# Patient Record
Sex: Male | Born: 1940 | Race: Black or African American | Hispanic: No | Marital: Single | Smoking: Current every day smoker
Health system: Southern US, Community
[De-identification: ages and names within clinical notes are randomized; demographics above are authoritative.]

## PROBLEM LIST (undated history)

## (undated) DIAGNOSIS — I219 Acute myocardial infarction, unspecified: Secondary | ICD-10-CM

## (undated) DIAGNOSIS — J449 Chronic obstructive pulmonary disease, unspecified: Secondary | ICD-10-CM

## (undated) DIAGNOSIS — Z9289 Personal history of other medical treatment: Secondary | ICD-10-CM

## (undated) DIAGNOSIS — I38 Endocarditis, valve unspecified: Secondary | ICD-10-CM

## (undated) DIAGNOSIS — K259 Gastric ulcer, unspecified as acute or chronic, without hemorrhage or perforation: Secondary | ICD-10-CM

## (undated) DIAGNOSIS — I10 Essential (primary) hypertension: Secondary | ICD-10-CM

## (undated) DIAGNOSIS — C801 Malignant (primary) neoplasm, unspecified: Secondary | ICD-10-CM

## (undated) DIAGNOSIS — I2510 Atherosclerotic heart disease of native coronary artery without angina pectoris: Secondary | ICD-10-CM

## (undated) DIAGNOSIS — F1010 Alcohol abuse, uncomplicated: Secondary | ICD-10-CM

## (undated) DIAGNOSIS — E785 Hyperlipidemia, unspecified: Secondary | ICD-10-CM

## (undated) DIAGNOSIS — M359 Systemic involvement of connective tissue, unspecified: Secondary | ICD-10-CM

## (undated) DIAGNOSIS — Z8719 Personal history of other diseases of the digestive system: Secondary | ICD-10-CM

## (undated) DIAGNOSIS — I509 Heart failure, unspecified: Secondary | ICD-10-CM

## (undated) HISTORY — PX: INSERT / REPLACE / REMOVE PACEMAKER: SUR710

## (undated) HISTORY — PX: COLON SURGERY: SHX602

## (undated) HISTORY — PX: CORONARY ARTERY BYPASS GRAFT: SHX141

## (undated) HISTORY — PX: COLONOSCOPY: SHX174

## (undated) HISTORY — PX: LUMBAR DISC SURGERY: SHX700

## (undated) HISTORY — PX: HEMORRHOID SURGERY: SHX153

## (undated) HISTORY — PX: UPPER GASTROINTESTINAL ENDOSCOPY: SHX188

---

## 2006-10-28 ENCOUNTER — Ambulatory Visit: Payer: Self-pay | Admitting: Internal Medicine

## 2007-03-22 ENCOUNTER — Ambulatory Visit: Payer: Self-pay | Admitting: Urology

## 2007-03-22 ENCOUNTER — Ambulatory Visit: Payer: Self-pay | Admitting: Cardiology

## 2007-03-22 ENCOUNTER — Other Ambulatory Visit: Payer: Self-pay

## 2007-04-06 ENCOUNTER — Ambulatory Visit: Payer: Self-pay | Admitting: Urology

## 2007-04-20 ENCOUNTER — Ambulatory Visit: Payer: Self-pay | Admitting: Urology

## 2007-04-24 ENCOUNTER — Ambulatory Visit: Payer: Self-pay | Admitting: Radiation Oncology

## 2007-04-28 ENCOUNTER — Ambulatory Visit: Payer: Self-pay | Admitting: Radiation Oncology

## 2007-05-22 ENCOUNTER — Ambulatory Visit: Payer: Self-pay | Admitting: Radiation Oncology

## 2007-06-22 ENCOUNTER — Ambulatory Visit: Payer: Self-pay | Admitting: Radiation Oncology

## 2007-07-22 ENCOUNTER — Ambulatory Visit: Payer: Self-pay | Admitting: Radiation Oncology

## 2007-08-22 ENCOUNTER — Ambulatory Visit: Payer: Self-pay | Admitting: Radiation Oncology

## 2007-10-22 ENCOUNTER — Ambulatory Visit: Payer: Self-pay | Admitting: Radiation Oncology

## 2007-11-16 ENCOUNTER — Ambulatory Visit: Payer: Self-pay | Admitting: Radiation Oncology

## 2007-11-22 ENCOUNTER — Ambulatory Visit: Payer: Self-pay | Admitting: Radiation Oncology

## 2008-02-17 ENCOUNTER — Ambulatory Visit: Payer: Self-pay | Admitting: Family Medicine

## 2008-02-20 ENCOUNTER — Ambulatory Visit: Payer: Self-pay | Admitting: Internal Medicine

## 2008-04-23 ENCOUNTER — Ambulatory Visit: Payer: Self-pay | Admitting: Radiation Oncology

## 2008-05-17 ENCOUNTER — Ambulatory Visit: Payer: Self-pay | Admitting: Radiation Oncology

## 2008-05-21 ENCOUNTER — Ambulatory Visit: Payer: Self-pay | Admitting: Radiation Oncology

## 2009-07-17 ENCOUNTER — Ambulatory Visit: Payer: Self-pay | Admitting: Unknown Physician Specialty

## 2009-07-25 ENCOUNTER — Ambulatory Visit: Payer: Self-pay | Admitting: Specialist

## 2009-10-15 ENCOUNTER — Inpatient Hospital Stay: Payer: Self-pay | Admitting: Internal Medicine

## 2009-12-18 ENCOUNTER — Ambulatory Visit: Payer: Self-pay | Admitting: Surgery

## 2009-12-23 ENCOUNTER — Inpatient Hospital Stay: Payer: Self-pay | Admitting: Surgery

## 2009-12-24 LAB — PATHOLOGY REPORT

## 2010-01-20 ENCOUNTER — Ambulatory Visit: Payer: Self-pay | Admitting: Surgery

## 2010-04-28 ENCOUNTER — Ambulatory Visit: Payer: Self-pay | Admitting: Radiation Oncology

## 2010-04-29 LAB — PSA

## 2010-05-22 ENCOUNTER — Ambulatory Visit: Payer: Self-pay | Admitting: Radiation Oncology

## 2011-05-14 ENCOUNTER — Ambulatory Visit: Payer: Self-pay | Admitting: Radiation Oncology

## 2011-05-15 LAB — PSA: PSA: 0.2 ng/mL

## 2011-05-22 ENCOUNTER — Ambulatory Visit: Payer: Self-pay | Admitting: Radiation Oncology

## 2011-07-20 ENCOUNTER — Ambulatory Visit: Payer: Self-pay

## 2011-07-20 ENCOUNTER — Inpatient Hospital Stay: Payer: Self-pay | Admitting: Internal Medicine

## 2011-07-20 LAB — CBC WITH DIFFERENTIAL/PLATELET
Basophil #: 0 10*3/uL (ref 0.0–0.1)
HCT: 41.1 % (ref 40.0–52.0)
Lymphocyte %: 21.6 %
MCH: 33.5 pg (ref 26.0–34.0)
MCHC: 34.2 g/dL (ref 32.0–36.0)
MCV: 98 fL (ref 80–100)
Monocyte #: 0.6 x10 3/mm (ref 0.2–1.0)
Neutrophil #: 2.3 10*3/uL (ref 1.4–6.5)
Neutrophil %: 61.5 %
Platelet: 153 10*3/uL (ref 150–440)
RDW: 13.1 % (ref 11.5–14.5)
WBC: 3.8 10*3/uL (ref 3.8–10.6)

## 2011-07-20 LAB — COMPREHENSIVE METABOLIC PANEL
Albumin: 3.8 g/dL (ref 3.4–5.0)
Anion Gap: 10 (ref 7–16)
Bilirubin,Total: 0.7 mg/dL (ref 0.2–1.0)
Chloride: 100 mmol/L (ref 98–107)
Co2: 30 mmol/L (ref 21–32)
Creatinine: 0.9 mg/dL (ref 0.60–1.30)
EGFR (African American): >60
Glucose: 126 mg/dL — ABNORMAL HIGH (ref 65–99)
Potassium: 4.3 mmol/L (ref 3.5–5.1)
Sodium: 140 mmol/L (ref 136–145)
Total Protein: 6.7 g/dL (ref 6.4–8.2)

## 2011-07-20 LAB — CBC
HCT: 41.2 % (ref 40.0–52.0)
HGB: 13.8 g/dL (ref 13.0–18.0)
MCHC: 33.6 g/dL (ref 32.0–36.0)
MCV: 98 fL (ref 80–100)
RBC: 4.2 10*6/uL — ABNORMAL LOW (ref 4.40–5.90)
RDW: 13.7 % (ref 11.5–14.5)

## 2011-07-20 LAB — CK TOTAL AND CKMB (NOT AT ARMC): CK-MB: 5.8 ng/mL — ABNORMAL HIGH (ref 0.5–3.6)

## 2011-07-20 LAB — BASIC METABOLIC PANEL
BUN: 9 mg/dL (ref 7–18)
Co2: 29 mmol/L (ref 21–32)
Creatinine: 0.8 mg/dL (ref 0.60–1.30)
EGFR (African American): >60
EGFR (Non-African Amer.): >60
Glucose: 90 mg/dL (ref 65–99)
Potassium: 4.1 mmol/L (ref 3.5–5.1)

## 2011-07-21 LAB — CBC WITH DIFFERENTIAL/PLATELET
Eosinophil #: 0.1 10*3/uL (ref 0.0–0.7)
Eosinophil %: 2.9 %
HCT: 41.9 % (ref 40.0–52.0)
HGB: 14.1 g/dL (ref 13.0–18.0)
Lymphocyte #: 0.9 10*3/uL — ABNORMAL LOW (ref 1.0–3.6)
Lymphocyte %: 27.5 %
MCH: 33.3 pg (ref 26.0–34.0)
MCHC: 33.6 g/dL (ref 32.0–36.0)
Monocyte %: 19.6 %
Neutrophil #: 1.7 10*3/uL (ref 1.4–6.5)
Neutrophil %: 49.3 %
RBC: 4.23 10*6/uL — ABNORMAL LOW (ref 4.40–5.90)
RDW: 13.3 % (ref 11.5–14.5)
WBC: 3.4 10*3/uL — ABNORMAL LOW (ref 3.8–10.6)

## 2011-07-21 LAB — CK TOTAL AND CKMB (NOT AT ARMC)
CK, Total: 502 U/L — ABNORMAL HIGH (ref 35–232)
CK-MB: 5 ng/mL — ABNORMAL HIGH (ref 0.5–3.6)

## 2011-07-21 LAB — BASIC METABOLIC PANEL
Calcium, Total: 8.6 mg/dL (ref 8.5–10.1)
Creatinine: 0.62 mg/dL (ref 0.60–1.30)
EGFR (Non-African Amer.): >60

## 2011-07-21 LAB — HEMOGLOBIN A1C: Hemoglobin A1C: 5.1 % (ref 4.2–6.3)

## 2011-07-21 LAB — LIPID PANEL
Ldl Cholesterol, Calc: 87 mg/dL (ref 0–100)
Triglycerides: 97 mg/dL (ref 0–200)

## 2011-07-22 LAB — CBC WITH DIFFERENTIAL/PLATELET
Basophil #: 0 10*3/uL (ref 0.0–0.1)
Basophil %: 0.6 %
Eosinophil #: 0.1 10*3/uL (ref 0.0–0.7)
HCT: 39.7 % — ABNORMAL LOW (ref 40.0–52.0)
Lymphocyte #: 1.1 10*3/uL (ref 1.0–3.6)
MCH: 33.5 pg (ref 26.0–34.0)
Monocyte #: 0.8 x10 3/mm (ref 0.2–1.0)
Neutrophil %: 62.2 %
Platelet: 148 10*3/uL — ABNORMAL LOW (ref 150–440)
RBC: 4.02 10*6/uL — ABNORMAL LOW (ref 4.40–5.90)

## 2011-07-22 LAB — BASIC METABOLIC PANEL
Calcium, Total: 8.6 mg/dL (ref 8.5–10.1)
Chloride: 108 mmol/L — ABNORMAL HIGH (ref 98–107)
Creatinine: 0.68 mg/dL (ref 0.60–1.30)
EGFR (African American): >60
EGFR (Non-African Amer.): >60
Glucose: 85 mg/dL (ref 65–99)
Osmolality: 275 (ref 275–301)
Potassium: 4 mmol/L (ref 3.5–5.1)
Sodium: 139 mmol/L (ref 136–145)

## 2011-10-21 DIAGNOSIS — J449 Chronic obstructive pulmonary disease, unspecified: Secondary | ICD-10-CM | POA: Insufficient documentation

## 2012-03-21 ENCOUNTER — Ambulatory Visit: Payer: Self-pay | Admitting: Emergency Medicine

## 2012-03-21 DIAGNOSIS — R079 Chest pain, unspecified: Secondary | ICD-10-CM

## 2012-04-26 ENCOUNTER — Emergency Department: Payer: Self-pay | Admitting: Emergency Medicine

## 2012-04-26 LAB — COMPREHENSIVE METABOLIC PANEL
Albumin: 3.6 g/dL (ref 3.4–5.0)
Alkaline Phosphatase: 63 U/L (ref 50–136)
Anion Gap: 8 (ref 7–16)
BUN: 6 mg/dL — ABNORMAL LOW (ref 7–18)
Calcium, Total: 8.7 mg/dL (ref 8.5–10.1)
Chloride: 107 mmol/L (ref 98–107)
Creatinine: 0.86 mg/dL (ref 0.60–1.30)
EGFR (African American): >60
SGOT(AST): 27 U/L (ref 15–37)
SGPT (ALT): 20 U/L (ref 12–78)
Sodium: 137 mmol/L (ref 136–145)

## 2012-04-26 LAB — CBC
HGB: 12.4 g/dL — ABNORMAL LOW (ref 13.0–18.0)
MCH: 33.3 pg (ref 26.0–34.0)
MCHC: 33.4 g/dL (ref 32.0–36.0)
MCV: 100 fL (ref 80–100)
Platelet: 224 10*3/uL (ref 150–440)
RDW: 14.6 % — ABNORMAL HIGH (ref 11.5–14.5)
WBC: 5.4 10*3/uL (ref 3.8–10.6)

## 2012-04-26 LAB — TROPONIN I: Troponin-I: 0.04 ng/mL

## 2012-05-11 ENCOUNTER — Ambulatory Visit: Payer: Self-pay | Admitting: Radiation Oncology

## 2012-05-14 LAB — PSA: PSA: 0.2 ng/mL (ref 0.0–4.0)

## 2012-05-21 ENCOUNTER — Ambulatory Visit: Payer: Self-pay | Admitting: Radiation Oncology

## 2012-06-13 DIAGNOSIS — G4700 Insomnia, unspecified: Secondary | ICD-10-CM | POA: Insufficient documentation

## 2013-05-11 ENCOUNTER — Ambulatory Visit: Payer: Self-pay | Admitting: Radiation Oncology

## 2013-08-07 ENCOUNTER — Ambulatory Visit: Payer: Self-pay | Admitting: Unknown Physician Specialty

## 2013-08-09 LAB — PATHOLOGY REPORT

## 2014-04-26 ENCOUNTER — Ambulatory Visit: Payer: Self-pay | Admitting: Gastroenterology

## 2014-07-15 NOTE — Discharge Summary (Signed)
PATIENT NAME:  Henry Mcmahon, Henry Mcmahon MR#:  [id number] DATE OF BIRTH:  1940-10-10  DATE OF ADMISSION:  07/20/2011 DATE OF DISCHARGE:  07/22/2011  CONSULTANTS: Serafina Royals, MD - Cardiology  DISCHARGE DIAGNOSES:  1. Unstable angina. 2. Coronary artery disease. No acute myocardial infarction.  3. Hypertension. 4. Chronic obstructive pulmonary disease.  PROCEDURES: Cardiac catheterization.   CONDITION: Stable.   HOME MEDICATIONS:  1. Aspirin 81 mg p.o. daily. 2. Lisinopril 10 mg p.o. daily.  3. Lopressor 25 mg p.o. once daily, extended-release. 4. Omeprazole 20 mg p.o. daily.  5. Multivitamin 1 tablet p.o. daily.  6. Ventolin HFA 90 mcg two puffs inhalation p.r.n. every 4 hours.  ADDITIONAL MEDICATIONS:  1. Nitroglycerin 0.4 mg sublingual every five minutes p.r.n., up to three times for chest pain.  2. Lipitor 10 mg p.o. daily.  3. Plavix 75 mg p.o. daily.   DIET: Low sodium diet.   ACTIVITY: As tolerated.   FOLLOWUP CARE: Followup with primary care physician within 1 to 2 weeks. Follow-up with Dr. Nehemiah Massed within one week. Smoking cessation consult.   REASON FOR ADMISSION: Chest pain.   HOSPITAL COURSE:  1. The patient is a 74 year old male with a history of chronic obstructive pulmonary disease, hypertension, and coronary artery disease status post CABG in 2002 at [place] who presented to the ED with chest pain for two months. The patient developed chest pain about one day prior to this admission which was pressure-like and associated with some trouble breathing. He took nitroglycerin with some relief. The patient was seen by Dr. Nehemiah Massed in the ED due to abnormal EKG concerning ST elevation. Dr. Nehemiah Massed recommended the patient's EKG is consistent with MI, so he recommended to admit the patient to the CCU and he needed a cardiac catheterization. Dr. Nehemiah Massed did cardiac catheterization yesterday and placed a drug-eluding stent in the RCA. He recommended continuing aspirin and Plavix and  may be discharged today. After admission the patient has been placed on aspirin, beta blocker, ACE inhibitor, and heparin drip before the cardiac catheterization. The patient's troponin has been negative so acute MI was ruled out.  2. Hypertension. The patient's blood pressure has been controlled with ACE inhibitor and beta blocker.  3. Chronic obstructive pulmonary disease. This has been stable.   On discharge today, his temperature is 98.3, blood pressure 145/63, pulse 54, and oxygen saturation 98% on room air. Physical examination is unremarkable. The patient has no complaints. He is clinically stable and will be discharged to home today. I discussed with the patient and the nurse about the discharge plan.   TIME SPENT: About 40 minutes.  ____________________________ Demetrios Loll, MD qc:slb D: 07/22/2011 15:25:06 ET T: 07/23/2011 15:27:07 ET JOB#: [id number]  cc: Demetrios Loll, MD, <Dictator> Demetrios Loll MD ELECTRONICALLY SIGNED 07/23/2011 16:48

## 2014-07-15 NOTE — Consult Note (Signed)
PATIENT NAME:  Henry Mcmahon, Henry Mcmahon MR#:  [id number] DATE OF BIRTH:  10/19/1940  DATE OF CONSULTATION:  07/20/2011  REFERRING PHYSICIAN:   CONSULTING PHYSICIAN:  Corey Skains, MD  PRIMARY CARE PHYSICIAN: None.   CHIEF COMPLAINT: "I have chest pain."   HISTORY OF PRESENT ILLNESS: This is 74 year old male with known coronary artery disease, status post coronary artery bypass graft in 2002. He has hypertension, hyperlipidemia on appropriate medications with new onset of substernal chest discomfort radiating into the upper abdomen. The patient has had known cardiovascular disease in the past with significant tobacco abuse and occasional alcohol use as well. The patient has had this chest discomfort off and on for the last several weeks to a month but it is intensifying in the last several days. He has had some chest pain last night for which he is seen today in the ER. Patient has had an EKG with laboratories pending. EKG shows normal sinus rhythm with anterior infarct, age undetermined. The patient has had no further chest pain after addition of nitrates.   REVIEW OF SYSTEMS: Remainder of review of systems negative for vision change, ringing in the ears, hearing loss, cough, congestion, heartburn, nausea, vomiting, diarrhea, bloody stools, stomach pain, extremity pain, leg weakness, cramping of the buttocks, known blood clots, headaches, blackouts, dizzy spells, nosebleeds, congestion, trouble swallowing, frequent urination, urination at night, muscle weakness, numbness, anxiety, depression, skin lesions, skin rashes.   PAST MEDICAL HISTORY:  1. Known coronary artery disease. 2. Hypertension.  3. Hyperlipidemia.   FAMILY HISTORY: No family members with early onset of cardiovascular disease.   SOCIAL HISTORY: Smokes 1 pack per day and occasionally drinks alcohol.   ALLERGIES: He has no known drug allergies.   CURRENT MEDICATIONS: As listed.   PHYSICAL EXAMINATION:  VITAL SIGNS: Blood pressure  126/68 bilaterally, heart rate 72 upright, reclining, and regular.   GENERAL: He is a well-appearing male in no acute distress.   HEENT: No icterus, thyromegaly, ulcers, hemorrhage, or xanthelasma.   CARDIOVASCULAR: Regular rate and rhythm. Normal S1, S2 without murmur, gallop, rub. Point of maximal impulse is normal size and placement. Carotid upstroke normal without bruit. Jugular venous pressure normal.   LUNGS: Lungs clear to auscultation with normal respirations.   ABDOMEN: Soft, nontender, without hepatosplenomegaly or masses. Abdominal aorta is normal size without bruit.   EXTREMITIES: 2+ bilateral pulses in dorsal, pedal, radial, and femoral arteries without lower extremity edema, cyanosis, clubbing, ulcers.   NEUROLOGIC: He is oriented to time, place, and person with normal mood and affect.   ASSESSMENT: 74 year old male with known coronary artery disease, hypertension, hyperlipidemia with chronic 3 to 4 week episodes of chest discomfort with significant escalation and waking him up from sleep having an abnormal EKG consistent with myocardial infarction.   RECOMMENDATIONS:  1. Heparin, aspirin, nitrates. 2. Admit to telemetry with telemetry unit nursing following for further significant symptoms and further treatment options.  3. Proceed to cardiac catheterization to assess coronary anatomy and further treatment thereof as necessary. Patient understands the risks and benefits of cardiac catheterization. These include the possibility of death, stroke, heart attack, infection, bleeding, or blood clot. He is at low risk for conscious sedation.  ____________________________ Corey Skains, MD bjk:cms D: 07/20/2011 13:37:39 ET T: 07/20/2011 13:53:58 ET  JOB#: [id number] Corey Skains MD ELECTRONICALLY SIGNED 07/30/2011 13:33

## 2014-07-15 NOTE — H&P (Signed)
PATIENT NAME:  Henry Mcmahon, Henry Mcmahon MR#:  [id number] DATE OF BIRTH:  21-Mar-1941  DATE OF ADMISSION:  07/20/2011  PRIMARY CARE PHYSICIAN: None.   CARDIOLOGIST: Serafina Royals, MD    CHIEF COMPLAINT: Chest pain.   HISTORY OF PRESENT ILLNESS: The patient is a 73 year old male with history of coronary artery disease, sent from [facility] because of chest pain and right EKG changes. The patient has been having chest pain for almost two months, and this morning he felt like pressure in the chest associated with some trouble breathing. He took some nitroglycerin around 1:00 with some relief. The patient went to [facility]. He says that chest pressure is mainly in the middle of the chest, not radiating to the shoulder or the back of the neck. He denies any dizziness. He has no nausea, no vomiting, but does have exertional dyspnea. The patient also complains of weakness. The patient says the chest pain is relieved with nitrates. The patient was seen by Dr. Nehemiah Massed in the Emergency Room because the EKG done in the Emergency Room was concerning for ST elevations. So, the Emergency Room doctor has consulted Dr. Nehemiah Massed. He recommended the patient's EKG is consistent with myocardial infarction, and he is scheduled for cardiac catheterization tomorrow.   PAST MEDICAL/SURGICAL HISTORY: Significant for: 1. Chronic obstructive pulmonary disease. 2. Hypertension. 3. Coronary artery disease.  4. History of bypass surgery in 2002 at [facility].  ALLERGIES: No known allergies.   SOCIAL HISTORY: He lives with his sister. He smokes 1 pack of cigarettes for 3 days. He is trying to quit. No alcohol. No drugs.   MEDICATIONS:  1. Aspirin 81 mg daily. 2. Lisinopril 10 mg daily. 3. Toprol XL 25 mg daily.   4. Omeprazole 20 mg daily. 5. Ventolin 90 mcg inhalation as needed.   FAMILY HISTORY: Significant for father had coronary artery disease and peripheral vascular disease.   REVIEW OF SYSTEMS: CONSTITUTIONAL:  Has weakness but denies chest pain at this time. EYES: No blurred vision. ENT: No tinnitus. No ear pain. No epistaxis. No difficulty swallowing. RESPIRATORY: Has no cough. Has history of chronic obstructive pulmonary disease. CARDIOVASCULAR: Chest pain and has dyspnea on exertion. No syncope. No palpitations. GASTROINTESTINAL: No nausea. No vomiting. No abdominal pain. GENITOURINARY: No dysuria. ENDOCRINE: No polydipsia or nocturia. HEMATOLOGIC: No anemia or easy bruising. INTEGUMENT: No skin rashes. MUSCULOSKELETAL: Denies joint pain. NEUROLOGICAL: No numbness or transient ischemic attacks or seizures. PSYCHIATRIC: Has no anxiety or depression.   PHYSICAL EXAMINATION:  VITAL SIGNS: The patient's temperature is 96.9, pulse 65, respirations 18, blood pressure initially 193/91; and during my visit the blood pressure was 161/72, pulse 61.   GENERAL: The patient is a 74 year old male, not in distress, answering questions appropriately.   HEENT: Head: Atraumatic, normocephalic. Pupils are equally reacting to light. Extraocular movements are intact. ENT: No tympanic membrane congestion. No turbinate hypertrophy. No oropharyngeal erythema.   NECK: Normal range of motion. No thyroid enlargement. No JVD. No lymphadenopathy.   CARDIOVASCULAR: S1 and S2 regular. The patient's PMI is not displaced. No JVD.   LUNGS: Clear to auscultation. No wheeze. No rales.   ABDOMEN: Soft, nontender, nondistended. Bowel sounds are present.   EXTREMITIES: No extremity edema. No cyanosis. No clubbing.   SKIN: Warm and dry.   NEUROLOGIC: Cranial nerves II through XII are intact. No focal neurological deficit.   PSYCHIATRIC: Oriented to time, place, and person.   LABORATORY, DIAGNOSTIC AND RADIOLOGICAL DATA:  WBC 3.8, hemoglobin 13.8, hematocrit 41.2,  platelets 160.  Electrolytes: Sodium 139, potassium 4.1, chloride 102, bicarbonate 29, BUN 9, creatinine 0.80, glucose 90. Troponin is 0.02. CK total 608, CPK-MB 6.5.   Chest x-ray shows no acute disease, chronic obstructive pulmonary disease, and has a history of coronary artery bypass graft.  The patient's EKG here in the Emergency Room showed normal sinus with left atrial enlargement, 66 beats per minute and slight ST elevation in V1, V2. T wave inversions are present in V4, V5 and V6.   ASSESSMENT AND PLAN: The patient is a 74 year old male with a history of coronary artery disease and hypertension, came in because of chest pain symptoms concerning for acute myocardial infarction. The patient also has dyspnea on exertion. The patient is admitted to the Hospitalist Service on the Intensive Care Unit. Continue aspirin, beta blockers, and also nitrates, ACE inhibitors and heparin drip. He was seen by Dr. Nehemiah Massed, who is planning an angiogram. The patient is going to have serial troponins and also EKG. He will be monitored closely in the Intensive Care Unit because of his coronary artery disease and also his EKG changes   TIME SPENT: About 60 minutes of critical care time.   ____________________________ Epifanio Lesches, MD sk:cbb D: 07/20/2011 14:52:23 ET T: 07/20/2011 15:27:58 ET JOB#: [id number]  cc: Epifanio Lesches, MD, <Dictator> Epifanio Lesches MD ELECTRONICALLY SIGNED 07/21/2011 22:30

## 2014-08-31 DIAGNOSIS — I10 Essential (primary) hypertension: Secondary | ICD-10-CM | POA: Insufficient documentation

## 2014-09-03 ENCOUNTER — Other Ambulatory Visit: Payer: Self-pay | Admitting: Gastroenterology

## 2014-09-03 DIAGNOSIS — R634 Abnormal weight loss: Secondary | ICD-10-CM

## 2014-09-03 DIAGNOSIS — R197 Diarrhea, unspecified: Secondary | ICD-10-CM

## 2014-09-07 ENCOUNTER — Ambulatory Visit
Admission: RE | Admit: 2014-09-07 | Discharge: 2014-09-07 | Disposition: A | Discharge status: Home / Self Care | Payer: Medicare Other | Source: Ambulatory Visit | Attending: Gastroenterology | Admitting: Gastroenterology

## 2014-09-07 DIAGNOSIS — R634 Abnormal weight loss: Secondary | ICD-10-CM | POA: Diagnosis present

## 2014-09-07 DIAGNOSIS — R197 Diarrhea, unspecified: Secondary | ICD-10-CM | POA: Insufficient documentation

## 2014-09-07 HISTORY — DX: Systemic involvement of connective tissue, unspecified: M35.9

## 2014-09-07 MED ORDER — IOHEXOL 300 MG/ML  SOLN
85.0000 mL | Freq: Once | INTRAMUSCULAR | Status: AC | PRN
  Administered 2014-09-07: 100 mL via INTRAVENOUS

## 2014-09-11 ENCOUNTER — Other Ambulatory Visit: Payer: Self-pay | Admitting: Gastroenterology

## 2014-09-11 DIAGNOSIS — R634 Abnormal weight loss: Secondary | ICD-10-CM

## 2014-09-11 DIAGNOSIS — K529 Noninfective gastroenteritis and colitis, unspecified: Secondary | ICD-10-CM

## 2014-09-13 DIAGNOSIS — I447 Left bundle-branch block, unspecified: Secondary | ICD-10-CM | POA: Insufficient documentation

## 2014-09-17 ENCOUNTER — Ambulatory Visit
Admission: RE | Admit: 2014-09-17 | Discharge: 2014-09-17 | Disposition: A | Discharge status: Home / Self Care | Payer: Medicare Other | Source: Ambulatory Visit | Attending: Gastroenterology | Admitting: Gastroenterology

## 2014-09-17 DIAGNOSIS — Z9889 Other specified postprocedural states: Secondary | ICD-10-CM | POA: Diagnosis not present

## 2014-09-17 DIAGNOSIS — K219 Gastro-esophageal reflux disease without esophagitis: Secondary | ICD-10-CM | POA: Insufficient documentation

## 2014-09-17 DIAGNOSIS — K529 Noninfective gastroenteritis and colitis, unspecified: Secondary | ICD-10-CM

## 2014-09-17 DIAGNOSIS — R634 Abnormal weight loss: Secondary | ICD-10-CM | POA: Diagnosis present

## 2014-09-17 DIAGNOSIS — R197 Diarrhea, unspecified: Secondary | ICD-10-CM | POA: Diagnosis present

## 2014-12-20 DIAGNOSIS — T82837A Hemorrhage of cardiac prosthetic devices, implants and grafts, initial encounter: Secondary | ICD-10-CM | POA: Insufficient documentation

## 2015-02-28 DIAGNOSIS — I255 Ischemic cardiomyopathy: Secondary | ICD-10-CM | POA: Insufficient documentation

## 2015-03-29 ENCOUNTER — Other Ambulatory Visit: Payer: Self-pay | Admitting: Physical Medicine and Rehabilitation

## 2015-03-29 ENCOUNTER — Other Ambulatory Visit (HOSPITAL_COMMUNITY): Payer: Self-pay | Admitting: Physical Medicine and Rehabilitation

## 2015-03-29 DIAGNOSIS — M5417 Radiculopathy, lumbosacral region: Secondary | ICD-10-CM

## 2015-04-11 ENCOUNTER — Ambulatory Visit: Payer: Medicare Other | Attending: Physical Medicine and Rehabilitation

## 2015-04-17 DIAGNOSIS — M5136 Other intervertebral disc degeneration, lumbar region: Secondary | ICD-10-CM | POA: Insufficient documentation

## 2015-04-17 DIAGNOSIS — M5416 Radiculopathy, lumbar region: Secondary | ICD-10-CM | POA: Insufficient documentation

## 2015-04-17 DIAGNOSIS — M48062 Spinal stenosis, lumbar region with neurogenic claudication: Secondary | ICD-10-CM | POA: Insufficient documentation

## 2015-09-16 DIAGNOSIS — Z95810 Presence of automatic (implantable) cardiac defibrillator: Secondary | ICD-10-CM | POA: Insufficient documentation

## 2016-01-02 DIAGNOSIS — Z8711 Personal history of peptic ulcer disease: Secondary | ICD-10-CM | POA: Insufficient documentation

## 2016-01-02 DIAGNOSIS — R499 Unspecified voice and resonance disorder: Secondary | ICD-10-CM | POA: Insufficient documentation

## 2016-02-06 ENCOUNTER — Encounter
Admission: RE | Admit: 2016-02-06 | Discharge: 2016-02-06 | Disposition: A | Discharge status: Home / Self Care | Payer: Medicare Other | Source: Ambulatory Visit | Attending: Otolaryngology | Admitting: Otolaryngology

## 2016-02-06 DIAGNOSIS — I10 Essential (primary) hypertension: Secondary | ICD-10-CM | POA: Insufficient documentation

## 2016-02-06 DIAGNOSIS — Z01818 Encounter for other preprocedural examination: Secondary | ICD-10-CM | POA: Insufficient documentation

## 2016-02-06 DIAGNOSIS — J387 Other diseases of larynx: Secondary | ICD-10-CM | POA: Insufficient documentation

## 2016-02-06 DIAGNOSIS — Z01812 Encounter for preprocedural laboratory examination: Secondary | ICD-10-CM | POA: Insufficient documentation

## 2016-02-06 DIAGNOSIS — Z950 Presence of cardiac pacemaker: Secondary | ICD-10-CM | POA: Diagnosis not present

## 2016-02-06 DIAGNOSIS — I2510 Atherosclerotic heart disease of native coronary artery without angina pectoris: Secondary | ICD-10-CM | POA: Insufficient documentation

## 2016-02-06 HISTORY — DX: Chronic obstructive pulmonary disease, unspecified: J44.9

## 2016-02-06 HISTORY — DX: Personal history of other medical treatment: Z92.89

## 2016-02-06 HISTORY — DX: Essential (primary) hypertension: I10

## 2016-02-06 HISTORY — DX: Acute myocardial infarction, unspecified: I21.9

## 2016-02-06 HISTORY — DX: Personal history of other diseases of the digestive system: Z87.19

## 2016-02-06 HISTORY — DX: Gastric ulcer, unspecified as acute or chronic, without hemorrhage or perforation: K25.9

## 2016-02-06 HISTORY — DX: Atherosclerotic heart disease of native coronary artery without angina pectoris: I25.10

## 2016-02-06 HISTORY — DX: Alcohol abuse, uncomplicated: F10.10

## 2016-02-06 HISTORY — DX: Hyperlipidemia, unspecified: E78.5

## 2016-02-06 HISTORY — DX: Endocarditis, valve unspecified: I38

## 2016-02-06 HISTORY — DX: Heart failure, unspecified: I50.9

## 2016-02-06 LAB — BASIC METABOLIC PANEL
Anion gap: 7 (ref 5–15)
BUN: 6 mg/dL (ref 6–20)
CALCIUM: 9.1 mg/dL (ref 8.9–10.3)
CHLORIDE: 107 mmol/L (ref 101–111)
CO2: 25 mmol/L (ref 22–32)
Creatinine, Ser: 0.79 mg/dL (ref 0.61–1.24)
GFR calc Af Amer: >60 mL/min (ref >60)
GFR calc non Af Amer: >60 mL/min (ref >60)
Glucose, Bld: 63 mg/dL — ABNORMAL LOW (ref 65–99)
Potassium: 4.6 mmol/L (ref 3.5–5.1)
Sodium: 139 mmol/L (ref 135–145)

## 2016-02-06 LAB — CBC
HCT: 37.7 % — ABNORMAL LOW (ref 40.0–52.0)
HEMOGLOBIN: 12.4 g/dL — AB (ref 13.0–18.0)
MCH: 25.9 pg — AB (ref 26.0–34.0)
MCHC: 32.8 g/dL (ref 32.0–36.0)
MCV: 78.9 fL — ABNORMAL LOW (ref 80.0–100.0)
Platelets: 203 10*3/uL (ref 150–440)
RBC: 4.78 MIL/uL (ref 4.40–5.90)
RDW: 20.5 % — AB (ref 11.5–14.5)
WBC: 4.1 10*3/uL (ref 3.8–10.6)

## 2016-02-06 NOTE — Patient Instructions (Signed)
Your procedure is scheduled on: Wednesday 02/12/16 Report to Day Surgery. 2ND FLOOR MEDICAL MALL ENTRANCE To find out your arrival time please call [phone] between 1PM - 3PM on Tuesday 02/11/16.  Remember: Instructions that are not followed completely may result in serious medical risk, up to and including death, or upon the discretion of your surgeon and anesthesiologist your surgery may need to be rescheduled.    __X__ 1. Do not eat food or drink liquids after midnight. No gum chewing or hard candies.     __X__ 2. No Alcohol for 24 hours before or after surgery.   ____ 3. Bring all medications with you on the day of surgery if instructed.    __X__ 4. Notify your doctor if there is any change in your medical condition     (cold, fever, infections).     Do not wear jewelry, make-up, hairpins, clips or nail polish.  Do not wear lotions, powders, or perfumes.   Do not shave 48 hours prior to surgery. Men may shave face and neck.  Do not bring valuables to the hospital.    [facility] is not responsible for any belongings or valuables.               Contacts, dentures or bridgework may not be worn into surgery.  Leave your suitcase in the car. After surgery it may be brought to your room.  For patients admitted to the hospital, discharge time is determined by your                treatment team.   Patients discharged the day of surgery will not be allowed to drive home.   Please read over the following fact sheets that you were given:   Pain Booklet   __X__ Take these medicines the morning of surgery with A SIP OF WATER:    1. LISINOPRIL  2. METOPROLOL  3. PANTOPRAZOLE  4.  5.  6.  ____ Fleet Enema (as directed)   ____ Use CHG Soap as directed  __X__ Use inhalers on the day of surgery  ____ Stop metformin 2 days prior to surgery    ____ Take 1/2 of usual insulin dose the night before surgery and none on the morning of surgery.   __X__ Stop Coumadin/Plavix/aspirin on  AS INSTRUCTED BY CARDIOLOGIST.  ____ Stop Anti-inflammatories on    ____ Stop supplements until after surgery.    ____ Bring C-Pap to the hospital.

## 2016-02-07 NOTE — Pre-Procedure Instructions (Signed)
Requested H&P from Dr. Richardson Landry.

## 2016-02-10 NOTE — Pre-Procedure Instructions (Signed)
CLEARED BY DR [place]

## 2016-02-19 ENCOUNTER — Ambulatory Visit
Admission: RE | Admit: 2016-02-19 | Discharge: 2016-02-19 | Disposition: A | Discharge status: Home / Self Care | Payer: Medicare Other | Source: Ambulatory Visit | Attending: Otolaryngology | Admitting: Otolaryngology

## 2016-02-19 ENCOUNTER — Ambulatory Visit: Payer: Medicare Other | Admitting: Certified Registered"

## 2016-02-19 ENCOUNTER — Encounter
Admission: RE | Disposition: A | Discharge status: Home / Self Care | Payer: Self-pay | Source: Ambulatory Visit | Attending: Otolaryngology

## 2016-02-19 ENCOUNTER — Encounter: Payer: Self-pay | Admitting: *Deleted

## 2016-02-19 DIAGNOSIS — I2510 Atherosclerotic heart disease of native coronary artery without angina pectoris: Secondary | ICD-10-CM | POA: Diagnosis not present

## 2016-02-19 DIAGNOSIS — J449 Chronic obstructive pulmonary disease, unspecified: Secondary | ICD-10-CM | POA: Diagnosis not present

## 2016-02-19 DIAGNOSIS — I110 Hypertensive heart disease with heart failure: Secondary | ICD-10-CM | POA: Insufficient documentation

## 2016-02-19 DIAGNOSIS — F17200 Nicotine dependence, unspecified, uncomplicated: Secondary | ICD-10-CM | POA: Diagnosis not present

## 2016-02-19 DIAGNOSIS — I509 Heart failure, unspecified: Secondary | ICD-10-CM | POA: Insufficient documentation

## 2016-02-19 DIAGNOSIS — C320 Malignant neoplasm of glottis: Secondary | ICD-10-CM | POA: Insufficient documentation

## 2016-02-19 DIAGNOSIS — K279 Peptic ulcer, site unspecified, unspecified as acute or chronic, without hemorrhage or perforation: Secondary | ICD-10-CM | POA: Insufficient documentation

## 2016-02-19 DIAGNOSIS — I252 Old myocardial infarction: Secondary | ICD-10-CM | POA: Diagnosis not present

## 2016-02-19 HISTORY — PX: MICROLARYNGOSCOPY: SHX5208

## 2016-02-19 SURGERY — MICROLARYNGOSCOPY
Anesthesia: General | Wound class: Clean

## 2016-02-19 MED ORDER — ONDANSETRON HCL 4 MG/2ML IJ SOLN
INTRAMUSCULAR | Status: DC | PRN
  Administered 2016-02-19: 4 mg via INTRAVENOUS

## 2016-02-19 MED ORDER — ROCURONIUM BROMIDE 100 MG/10ML IV SOLN
INTRAVENOUS | Status: DC | PRN
  Administered 2016-02-19: 30 mg via INTRAVENOUS
  Administered 2016-02-19: 10 mg via INTRAVENOUS

## 2016-02-19 MED ORDER — ETOMIDATE 2 MG/ML IV SOLN
INTRAVENOUS | Status: DC | PRN
  Administered 2016-02-19: 14 mg via INTRAVENOUS

## 2016-02-19 MED ORDER — METHYLENE BLUE 0.5 % INJ SOLN
INTRAVENOUS | Status: AC
  Filled 2016-02-19: qty 10

## 2016-02-19 MED ORDER — LACTATED RINGERS IV SOLN
INTRAVENOUS | Status: DC
  Administered 2016-02-19: 06:00:00 via INTRAVENOUS

## 2016-02-19 MED ORDER — FENTANYL CITRATE (PF) 100 MCG/2ML IJ SOLN
INTRAMUSCULAR | Status: DC | PRN
  Administered 2016-02-19: 25 ug via INTRAVENOUS
  Administered 2016-02-19: 100 ug via INTRAVENOUS

## 2016-02-19 MED ORDER — OXYMETAZOLINE HCL 0.05 % NA SOLN
NASAL | Status: DC | PRN
  Administered 2016-02-19: 1

## 2016-02-19 MED ORDER — LIDOCAINE-EPINEPHRINE (PF) 1 %-1:200000 IJ SOLN
INTRAMUSCULAR | Status: AC
  Filled 2016-02-19: qty 30

## 2016-02-19 MED ORDER — LIDOCAINE HCL (CARDIAC) 20 MG/ML IV SOLN
INTRAVENOUS | Status: DC | PRN
  Administered 2016-02-19: 60 mg via INTRAVENOUS

## 2016-02-19 MED ORDER — OXYMETAZOLINE HCL 0.05 % NA SOLN
NASAL | Status: AC
  Filled 2016-02-19: qty 15

## 2016-02-19 MED ORDER — SUGAMMADEX SODIUM 200 MG/2ML IV SOLN
INTRAVENOUS | Status: DC | PRN
  Administered 2016-02-19: 130 mg via INTRAVENOUS

## 2016-02-19 MED ORDER — DEXAMETHASONE SODIUM PHOSPHATE 10 MG/ML IJ SOLN
INTRAMUSCULAR | Status: DC | PRN
  Administered 2016-02-19: 4 mg via INTRAVENOUS

## 2016-02-19 MED ORDER — FENTANYL CITRATE (PF) 100 MCG/2ML IJ SOLN: 25.0000 ug | INTRAMUSCULAR | Status: DC | PRN

## 2016-02-19 MED ORDER — ONDANSETRON HCL 4 MG/2ML IJ SOLN: 4.0000 mg | Freq: Once | INTRAMUSCULAR | Status: DC | PRN

## 2016-02-19 MED ORDER — ESMOLOL HCL 100 MG/10ML IV SOLN
INTRAVENOUS | Status: DC | PRN
  Administered 2016-02-19: 30 mg via INTRAVENOUS

## 2016-02-19 MED ORDER — HYDROCODONE-ACETAMINOPHEN 7.5-325 MG/15ML PO SOLN: ORAL | 0 refills | Status: DC

## 2016-02-19 SURGICAL SUPPLY — 17 items
BANDAGE EYE OVAL (MISCELLANEOUS) ×6 IMPLANT
CANISTER SUCT 1200ML W/VALVE (MISCELLANEOUS) ×3 IMPLANT
DEPRESSOR TONGUE BLADE STERILE (MISCELLANEOUS) ×3 IMPLANT
DRESSING TELFA 4X3 1S ST N-ADH (GAUZE/BANDAGES/DRESSINGS) ×3 IMPLANT
GAUZE SPONGE 4X4 12PLY STRL (GAUZE/BANDAGES/DRESSINGS) ×3 IMPLANT
GLOVE BIO SURGEON STRL SZ7.5 (GLOVE) ×3 IMPLANT
IV SET EXTENSION MINI BORE EPI (IV SETS) ×3 IMPLANT
KIT RM TURNOVER STRD PROC AR (KITS) ×3 IMPLANT
LABEL OR SOLS (LABEL) ×3 IMPLANT
NDL ENDOSCOPIC URO 20G (NEEDLE) ×3 IMPLANT
NEEDLE HYPO 27GX1-1/4 (NEEDLE) ×3 IMPLANT
PACK HEAD/NECK (MISCELLANEOUS) ×3 IMPLANT
PATTIES SURGICAL .5 X.5 (GAUZE/BANDAGES/DRESSINGS) ×3 IMPLANT
SOL ANTI-FOG 6CC FOG-OUT (MISCELLANEOUS) ×1 IMPLANT
SOL FOG-OUT ANTI-FOG 6CC (MISCELLANEOUS) ×2
TUBING SMOKE EVAC 6FT (TUBING) ×3 IMPLANT
WATER STERILE IRR 1000ML POUR (IV SOLUTION) ×3 IMPLANT

## 2016-02-19 NOTE — Op Note (Signed)
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  At an appropriate level, the table was turned 90 degrees away from Anesthesia.  A clean preparation and draping was performed in the standard fashion. The oropharynx, oral cavity, nasopharynx and hypopharynx were palpated with findings as described above.  A Raytec was placed to protect the gingiva. Using the Dedo laryngoscope, the oropharynx, hypopharynx and larynx were carefully inspected. The scope was placed in the endolarynx and into suspension with the Lewy arm. Photodocumentation was obtained with the 0 degree scope. The operating microscope was used for better visualization.   Biopsies were taken from the right true vocal cord. Bleeding was controlled with Afrin moistened pledgets.  The findings were as described above.  The laryngoscope was removed.  The neck was palpated on both sides with the findings as described above.  At this point the procedure was completed.  Dental status was intact.  The patient was returned to Anesthesia, awakened, extubated, and transferred to PACU in satisfactory condition.   Disposition: To PACU, then discharge home  Plan: Soft, bland diet, advance as tolerated. Take pain medications as prescribed. Follow-up in 3 weeks.  Riley Nearing 02/19/2016 8:15 AM

## 2016-02-19 NOTE — OR Nursing (Signed)
Pt advises he's out of his lisinopril for two days, advises last dose Monday 02/19/16 am.  His metoprolol and pantoprazole given in preop (own meds) after taking vital signs.

## 2016-02-19 NOTE — H&P (Signed)
History and physical reviewed and will be scanned in later. No change in medical status reported by the patient or family, appears stable for surgery. All questions regarding the procedure answered, and patient (or family if a child) expressed understanding of the procedure.  Henry Mcmahon S @TODAY@ 

## 2016-02-19 NOTE — Anesthesia Postprocedure Evaluation (Signed)
Anesthesia Post Note  Patient: Henry Mcmahon  Procedure(s) Performed: Procedure(s) (LRB): MICROLARYNGOSCOPY (N/A)  Patient location during evaluation: PACU Anesthesia Type: General Level of consciousness: awake Pain management: pain level controlled Vital Signs Assessment: post-procedure vital signs reviewed and stable Respiratory status: spontaneous breathing Cardiovascular status: stable Anesthetic complications: no    Last Vitals:  Vitals:   02/19/16 0911 02/19/16 0925  BP: (!) 156/73 (!) 147/73  Pulse: 60 (!) 59  Resp: 16 (!) 22  Temp: 36.6 C     Last Pain:  Vitals:   02/19/16 0841  TempSrc:   PainSc: Asleep                 VAN STAVEREN,Lillie Portner

## 2016-02-19 NOTE — Discharge Instructions (Signed)

## 2016-02-19 NOTE — Anesthesia Procedure Notes (Addendum)
Procedure Name: Intubation Performed by: Lance Muss Pre-anesthesia Checklist: Patient identified, Patient being monitored, Timeout performed, Emergency Drugs available and Suction available Patient Re-evaluated:Patient Re-evaluated prior to inductionOxygen Delivery Method: Circle system utilized Preoxygenation: Pre-oxygenation with 100% oxygen Intubation Type: IV induction Ventilation: Mask ventilation without difficulty, Oral airway inserted - appropriate to patient size and Two handed mask ventilation required Laryngoscope Size: Mac and 3 Grade View: Grade I Tube type: Oral Tube size: 6.5 mm Number of attempts: 2 Airway Equipment and Method: Stylet and LTA kit utilized Placement Confirmation: ETT inserted through vocal cords under direct vision,  positive ETCO2 and breath sounds checked- equal and bilateral Secured at: 20 cm Tube secured with: Tape Dental Injury: Teeth and Oropharynx as per pre-operative assessment

## 2016-02-19 NOTE — Anesthesia Preprocedure Evaluation (Signed)
Anesthesia Evaluation  Patient identified by MRN, date of birth, ID band Patient awake    Reviewed: Allergy & Precautions, NPO status   Airway Mallampati: II       Dental  (+) Upper Dentures, Lower Dentures   Pulmonary COPD, Current Smoker,    breath sounds clear to auscultation + decreased breath sounds      Cardiovascular Exercise Tolerance: Good hypertension, Pt. on home beta blockers + CAD, + Past MI and +CHF   Rhythm:Regular Rate:Normal     Neuro/Psych    GI/Hepatic Neg liver ROS, PUD,   Endo/Other  negative endocrine ROS  Renal/GU negative Renal ROS     Musculoskeletal   Abdominal Normal abdominal exam  (+)   Peds negative pediatric ROS (+)  Hematology negative hematology ROS (+)   Anesthesia Other Findings   Reproductive/Obstetrics                             Anesthesia Physical Anesthesia Plan  ASA: III  Anesthesia Plan: General   Post-op Pain Management:    Induction: Intravenous  Airway Management Planned: Oral ETT  Additional Equipment:   Intra-op Plan:   Post-operative Plan: Extubation in OR  Informed Consent: I have reviewed the patients History and Physical, chart, labs and discussed the procedure including the risks, benefits and alternatives for the proposed anesthesia with the patient or authorized representative who has indicated his/her understanding and acceptance.     Plan Discussed with: CRNA  Anesthesia Plan Comments:         Anesthesia Quick Evaluation

## 2016-02-19 NOTE — Transfer of Care (Signed)
Immediate Anesthesia Transfer of Care Note  Patient: Henry Mcmahon  Procedure(s) Performed: Procedure(s): MICROLARYNGOSCOPY (N/A)  Patient Location: PACU  Anesthesia Type:General  Level of Consciousness: sedated and responds to stimulation  Airway & Oxygen Therapy: Patient Spontanous Breathing and Patient connected to face mask oxygen  Post-op Assessment: Report given to RN and Post -op Vital signs reviewed and stable  Post vital signs: Reviewed and stable  Last Vitals:  Vitals:   02/19/16 0825 02/19/16 0827  BP: (!) 184/70 (!) 169/67  Pulse: 63 62  Resp: 16 15  Temp: 36.1 C     Last Pain:  Vitals:   02/19/16 0825  TempSrc:   PainSc: Asleep         Complications: No apparent anesthesia complications

## 2016-02-20 LAB — SURGICAL PATHOLOGY

## 2016-03-02 ENCOUNTER — Ambulatory Visit
Admission: RE | Admit: 2016-03-02 | Discharge: 2016-03-02 | Disposition: A | Discharge status: Home / Self Care | Payer: Medicare Other | Source: Ambulatory Visit | Attending: Radiation Oncology | Admitting: Radiation Oncology

## 2016-03-02 ENCOUNTER — Encounter: Payer: Self-pay | Admitting: Radiation Oncology

## 2016-03-02 ENCOUNTER — Other Ambulatory Visit: Payer: Self-pay | Admitting: *Deleted

## 2016-03-02 VITALS — BP 172/91 | HR 79 | Temp 98.5°F | Wt 145.2 lb

## 2016-03-02 DIAGNOSIS — E785 Hyperlipidemia, unspecified: Secondary | ICD-10-CM | POA: Diagnosis not present

## 2016-03-02 DIAGNOSIS — I2510 Atherosclerotic heart disease of native coronary artery without angina pectoris: Secondary | ICD-10-CM | POA: Insufficient documentation

## 2016-03-02 DIAGNOSIS — R490 Dysphonia: Secondary | ICD-10-CM | POA: Insufficient documentation

## 2016-03-02 DIAGNOSIS — Z79899 Other long term (current) drug therapy: Secondary | ICD-10-CM | POA: Diagnosis not present

## 2016-03-02 DIAGNOSIS — I509 Heart failure, unspecified: Secondary | ICD-10-CM | POA: Diagnosis not present

## 2016-03-02 DIAGNOSIS — I10 Essential (primary) hypertension: Secondary | ICD-10-CM | POA: Insufficient documentation

## 2016-03-02 DIAGNOSIS — Z923 Personal history of irradiation: Secondary | ICD-10-CM | POA: Diagnosis not present

## 2016-03-02 DIAGNOSIS — Z8546 Personal history of malignant neoplasm of prostate: Secondary | ICD-10-CM | POA: Insufficient documentation

## 2016-03-02 DIAGNOSIS — C320 Malignant neoplasm of glottis: Secondary | ICD-10-CM | POA: Diagnosis not present

## 2016-03-02 DIAGNOSIS — Z510 Encounter for antineoplastic radiation therapy: Secondary | ICD-10-CM | POA: Insufficient documentation

## 2016-03-02 DIAGNOSIS — Z7982 Long term (current) use of aspirin: Secondary | ICD-10-CM | POA: Insufficient documentation

## 2016-03-02 DIAGNOSIS — Z8719 Personal history of other diseases of the digestive system: Secondary | ICD-10-CM | POA: Insufficient documentation

## 2016-03-02 DIAGNOSIS — I38 Endocarditis, valve unspecified: Secondary | ICD-10-CM | POA: Diagnosis not present

## 2016-03-02 DIAGNOSIS — I252 Old myocardial infarction: Secondary | ICD-10-CM | POA: Diagnosis not present

## 2016-03-02 DIAGNOSIS — Z8711 Personal history of peptic ulcer disease: Secondary | ICD-10-CM | POA: Diagnosis not present

## 2016-03-02 DIAGNOSIS — C140 Malignant neoplasm of pharynx, unspecified: Secondary | ICD-10-CM

## 2016-03-02 DIAGNOSIS — I998 Other disorder of circulatory system: Secondary | ICD-10-CM | POA: Insufficient documentation

## 2016-03-02 DIAGNOSIS — F17210 Nicotine dependence, cigarettes, uncomplicated: Secondary | ICD-10-CM | POA: Insufficient documentation

## 2016-03-02 DIAGNOSIS — J449 Chronic obstructive pulmonary disease, unspecified: Secondary | ICD-10-CM | POA: Insufficient documentation

## 2016-03-02 NOTE — Consult Note (Signed)
NEW PATIENT EVALUATION  Name: Henry Mcmahon  MRN: [id number]  Date:   03/02/2016     DOB: 03/02/1941   This 75 y.o. 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  REFERRING PHYSICIAN: Robert Bellow, MD  CHIEF COMPLAINT:  Chief Complaint  Patient presents with  . 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 He is done well although recently presented with increasing hoarseness. He was noted to have a lesion of the right true cord and microlaryngoscopy and biopsy were performed. Findings were a lesion exophytic of the right true cord extending to the anterior commissure. 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 Patient is doing fairly well 7 no head and neck pain or dysphagia. His major complaint is his hoarseness. He is seen today for radiation oncology opinion.  PLANNED TREATMENT REGIMEN: External beam radiation therapy  PAST MEDICAL HISTORY:  has a past medical history of CHF (congestive heart failure) ([place]); Collagen vascular disease ([place]); COPD (chronic obstructive pulmonary disease) ([place]); Coronary artery disease; ETOH abuse; Gastric ulcer; H/O: UGI bleed; History of blood transfusion; HLD (hyperlipidemia); Hypertension; Myocardial infarction; and VHD (valvular heart disease).    PAST SURGICAL HISTORY:  Past Surgical History:  Procedure Laterality Date  . COLON SURGERY    . COLONOSCOPY    . CORONARY ARTERY BYPASS GRAFT    . HEMORRHOID SURGERY    . INSERT / REPLACE / REMOVE PACEMAKER    . LUMBAR DISC SURGERY    . MICROLARYNGOSCOPY N/A 02/19/2016   Procedure: MICROLARYNGOSCOPY;  Surgeon: Clyde Canterbury, MD;  Location: [facility] ORS;  Service: ENT;  Laterality: N/A;  . UPPER GASTROINTESTINAL ENDOSCOPY      FAMILY HISTORY: family history is not on file.  SOCIAL HISTORY:  reports that he has been smoking Cigarettes.  He has been smoking about 0.25 packs per day. He has never used smokeless tobacco. He reports that he drinks about 21.6 oz of alcohol per week . He reports that he does not use drugs.  ALLERGIES: Patient has no known allergies.  MEDICATIONS:  Current Outpatient Prescriptions  Medication Sig Dispense Refill  . albuterol (PROVENTIL HFA;VENTOLIN HFA) 108 (90 Base) MCG/ACT inhaler Inhale into the lungs.    Marland Kitchen aspirin EC 81 MG tablet Take by mouth.    Marland Kitchen atorvastatin (LIPITOR) 40 MG tablet Take 40 mg by mouth at bedtime.    . cholecalciferol (VITAMIN D) 1000 units tablet Take 1,000 Units by mouth daily.    . clopidogrel (PLAVIX) 75 MG tablet Take 75 mg by mouth daily.    Marland Kitchen HYDROcodone-acetaminophen (HYCET) 7.5-325 mg/15 ml solution 10-15 cc PO every 4-6 hours as needed for pain 200 mL 0  . lisinopril (PRINIVIL,ZESTRIL) 5 MG tablet Take 5 mg by mouth daily.    . magnesium oxide (MAG-OX) 400 MG tablet Take by mouth.    . metoprolol succinate (TOPROL-XL) 25 MG 24 hr tablet Take 25 mg by mouth daily.    . nitroGLYCERIN (NITROSTAT) 0.4 MG SL tablet Place 0.4 mg under the tongue every 5 (five) minutes as needed for chest pain.    . pantoprazole (PROTONIX) 40  MG tablet Take 40 mg by mouth daily.    Marland Kitchen PROVENTIL HFA 108 (90 Base) MCG/ACT inhaler Inhale 1-2 puffs into the lungs every 4 (four) hours as needed for wheezing or shortness of breath.  1   No current facility-administered medications for this encounter.     ECOG PERFORMANCE STATUS:  1 - Symptomatic but completely ambulatory  REVIEW OF SYSTEMS: Except for the hoarseness of his voice Patient denies any weight loss, fatigue, weakness, fever, chills or night sweats. Patient denies any  loss of vision, blurred vision. Patient denies any ringing  of the ears or hearing loss. No irregular heartbeat. Patient denies heart murmur or history of fainting. Patient denies any chest pain or pain radiating to her upper extremities. Patient denies any shortness of breath, difficulty breathing at night, cough or hemoptysis. Patient denies any swelling in the lower legs. Patient denies any nausea vomiting, vomiting of blood, or coffee ground material in the vomitus. Patient denies any stomach pain. Patient states has had normal bowel movements no significant constipation or diarrhea. Patient denies any dysuria, hematuria or significant nocturia. Patient denies any problems walking, swelling in the joints or loss of balance. Patient denies any skin changes, loss of hair or loss of weight. Patient denies any excessive worrying or anxiety or significant depression. Patient denies any problems with insomnia. Patient denies excessive thirst, polyuria, polydipsia. Patient denies any swollen glands, patient denies easy bruising or easy bleeding. Patient denies any recent infections, allergies or URI. Patient "s visual fields have not changed significantly in recent time.    PHYSICAL EXAM: BP (!) 172/91   Pulse 79   Temp 98.5 F (36.9 C)   Wt 145 lb 2.8 oz (65.9 kg)   BMI 20.83 kg/m  Oral cavity is clear with no oral mucosal lesions noted. Indirect mirror examination shows exophytic lesion of the right cord and some swelling. Otherwise upper airways clear vallecula and base of tongue are within normal limits. Neck is clear without evidence of subject gastric cervical or supraclavicular adenopathy. Well-developed well-nourished patient in NAD. HEENT reveals PERLA, EOMI, discs not visualized.  Oral cavity is clear. No oral mucosal lesions are identified. Neck is clear without evidence of cervical or supraclavicular adenopathy. Lungs are clear to A&P. Cardiac examination is essentially unremarkable with regular  rate and rhythm without murmur rub or thrill. Abdomen is benign with no organomegaly or masses noted. Motor sensory and DTR levels are equal and symmetric in the upper and lower extremities. Cranial nerves II through XII are grossly intact. Proprioception is intact. No peripheral adenopathy or edema is identified. No motor or sensory levels are noted. Crude visual fields are within normal range.  LABORATORY DATA: Pathology reports reviewed    RADIOLOGY RESULTS: CT scan of head and neck ordered   IMPRESSION: Probable stage I (T1 N0 M0) squamous cell carcinoma the larynx in 75 year old male  PLAN: At this time of ordered a CT scan to rule out possibility of adenopathy in the head and neck region. If that is normal will proceed with external beam radiation therapy to 6600 cGy in 33 fractions. Risks and benefits of treatment including possible dysphasia from radiation esophagitis possible worsening of his hoarseness skin reaction fatigue alteration of blood counts all were discussed in detail with the patient. After his CT scan I have personally ordered CT simulation. Patient and wife both noted contact me with any concerns.  I would like to take this opportunity to thank you for allowing me to participate  in the care of your patient.Armstead Peaks., MD

## 2016-03-05 ENCOUNTER — Ambulatory Visit
Admission: RE | Admit: 2016-03-05 | Discharge: 2016-03-05 | Disposition: A | Discharge status: Home / Self Care | Payer: Medicare Other | Source: Ambulatory Visit | Attending: Radiation Oncology | Admitting: Radiation Oncology

## 2016-03-05 DIAGNOSIS — M8448XA Pathological fracture, other site, initial encounter for fracture: Secondary | ICD-10-CM | POA: Insufficient documentation

## 2016-03-05 DIAGNOSIS — C140 Malignant neoplasm of pharynx, unspecified: Secondary | ICD-10-CM | POA: Diagnosis present

## 2016-03-05 HISTORY — DX: Malignant (primary) neoplasm, unspecified: C80.1

## 2016-03-05 MED ORDER — IOPAMIDOL (ISOVUE-300) INJECTION 61%
150.0000 mL | Freq: Once | INTRAVENOUS | Status: AC | PRN
  Administered 2016-03-05: 75 mL via INTRAVENOUS

## 2016-03-18 ENCOUNTER — Ambulatory Visit
Admission: RE | Admit: 2016-03-18 | Discharge: 2016-03-18 | Disposition: A | Discharge status: Home / Self Care | Payer: Medicare Other | Source: Ambulatory Visit | Attending: Radiation Oncology | Admitting: Radiation Oncology

## 2016-03-18 DIAGNOSIS — Z510 Encounter for antineoplastic radiation therapy: Secondary | ICD-10-CM | POA: Diagnosis not present

## 2016-03-19 DIAGNOSIS — Z510 Encounter for antineoplastic radiation therapy: Secondary | ICD-10-CM | POA: Diagnosis not present

## 2016-03-27 ENCOUNTER — Other Ambulatory Visit: Payer: Self-pay | Admitting: *Deleted

## 2016-03-27 DIAGNOSIS — C320 Malignant neoplasm of glottis: Secondary | ICD-10-CM

## 2016-03-30 ENCOUNTER — Ambulatory Visit
Admission: RE | Admit: 2016-03-30 | Discharge: 2016-03-30 | Disposition: A | Discharge status: Home / Self Care | Payer: Medicare Other | Source: Ambulatory Visit | Attending: Radiation Oncology | Admitting: Radiation Oncology

## 2016-03-30 DIAGNOSIS — R490 Dysphonia: Secondary | ICD-10-CM | POA: Diagnosis not present

## 2016-03-30 DIAGNOSIS — Z510 Encounter for antineoplastic radiation therapy: Secondary | ICD-10-CM | POA: Diagnosis not present

## 2016-03-30 DIAGNOSIS — I998 Other disorder of circulatory system: Secondary | ICD-10-CM | POA: Diagnosis not present

## 2016-03-30 DIAGNOSIS — Z923 Personal history of irradiation: Secondary | ICD-10-CM | POA: Diagnosis not present

## 2016-03-30 DIAGNOSIS — I10 Essential (primary) hypertension: Secondary | ICD-10-CM | POA: Diagnosis not present

## 2016-03-30 DIAGNOSIS — Z79899 Other long term (current) drug therapy: Secondary | ICD-10-CM | POA: Diagnosis not present

## 2016-03-30 DIAGNOSIS — I252 Old myocardial infarction: Secondary | ICD-10-CM | POA: Diagnosis not present

## 2016-03-30 DIAGNOSIS — I2510 Atherosclerotic heart disease of native coronary artery without angina pectoris: Secondary | ICD-10-CM | POA: Diagnosis not present

## 2016-03-30 DIAGNOSIS — J449 Chronic obstructive pulmonary disease, unspecified: Secondary | ICD-10-CM | POA: Diagnosis not present

## 2016-03-30 DIAGNOSIS — Z8719 Personal history of other diseases of the digestive system: Secondary | ICD-10-CM | POA: Diagnosis not present

## 2016-03-30 DIAGNOSIS — I38 Endocarditis, valve unspecified: Secondary | ICD-10-CM | POA: Diagnosis not present

## 2016-03-30 DIAGNOSIS — E785 Hyperlipidemia, unspecified: Secondary | ICD-10-CM | POA: Diagnosis not present

## 2016-03-30 DIAGNOSIS — I509 Heart failure, unspecified: Secondary | ICD-10-CM | POA: Diagnosis not present

## 2016-03-30 DIAGNOSIS — F17210 Nicotine dependence, cigarettes, uncomplicated: Secondary | ICD-10-CM | POA: Diagnosis not present

## 2016-03-30 DIAGNOSIS — Z7982 Long term (current) use of aspirin: Secondary | ICD-10-CM | POA: Diagnosis not present

## 2016-03-30 DIAGNOSIS — Z8546 Personal history of malignant neoplasm of prostate: Secondary | ICD-10-CM | POA: Diagnosis not present

## 2016-03-30 DIAGNOSIS — C320 Malignant neoplasm of glottis: Secondary | ICD-10-CM | POA: Diagnosis not present

## 2016-03-30 DIAGNOSIS — Z8711 Personal history of peptic ulcer disease: Secondary | ICD-10-CM | POA: Diagnosis not present

## 2016-03-31 ENCOUNTER — Ambulatory Visit
Admission: RE | Admit: 2016-03-31 | Discharge: 2016-03-31 | Disposition: A | Discharge status: Home / Self Care | Payer: Medicare Other | Source: Ambulatory Visit | Attending: Radiation Oncology | Admitting: Radiation Oncology

## 2016-03-31 DIAGNOSIS — Z510 Encounter for antineoplastic radiation therapy: Secondary | ICD-10-CM | POA: Diagnosis not present

## 2016-04-01 ENCOUNTER — Ambulatory Visit
Admission: RE | Admit: 2016-04-01 | Discharge: 2016-04-01 | Disposition: A | Discharge status: Home / Self Care | Payer: Medicare Other | Source: Ambulatory Visit | Attending: Radiation Oncology | Admitting: Radiation Oncology

## 2016-04-01 DIAGNOSIS — Z510 Encounter for antineoplastic radiation therapy: Secondary | ICD-10-CM | POA: Diagnosis not present

## 2016-04-02 ENCOUNTER — Ambulatory Visit
Admission: RE | Admit: 2016-04-02 | Discharge: 2016-04-02 | Disposition: A | Discharge status: Home / Self Care | Payer: Medicare Other | Source: Ambulatory Visit | Attending: Radiation Oncology | Admitting: Radiation Oncology

## 2016-04-02 DIAGNOSIS — Z510 Encounter for antineoplastic radiation therapy: Secondary | ICD-10-CM | POA: Diagnosis not present

## 2016-04-03 ENCOUNTER — Ambulatory Visit
Admission: RE | Admit: 2016-04-03 | Discharge: 2016-04-03 | Disposition: A | Discharge status: Home / Self Care | Payer: Medicare Other | Source: Ambulatory Visit | Attending: Radiation Oncology | Admitting: Radiation Oncology

## 2016-04-03 DIAGNOSIS — Z510 Encounter for antineoplastic radiation therapy: Secondary | ICD-10-CM | POA: Diagnosis not present

## 2016-04-06 ENCOUNTER — Ambulatory Visit
Admission: RE | Admit: 2016-04-06 | Discharge: 2016-04-06 | Disposition: A | Discharge status: Home / Self Care | Payer: Medicare Other | Source: Ambulatory Visit | Attending: Radiation Oncology | Admitting: Radiation Oncology

## 2016-04-06 ENCOUNTER — Inpatient Hospital Stay: Payer: Medicare Other | Attending: Radiation Oncology

## 2016-04-06 DIAGNOSIS — C140 Malignant neoplasm of pharynx, unspecified: Secondary | ICD-10-CM | POA: Insufficient documentation

## 2016-04-06 DIAGNOSIS — Z510 Encounter for antineoplastic radiation therapy: Secondary | ICD-10-CM | POA: Diagnosis not present

## 2016-04-07 ENCOUNTER — Ambulatory Visit
Admission: RE | Admit: 2016-04-07 | Discharge: 2016-04-07 | Disposition: A | Discharge status: Home / Self Care | Payer: Medicare Other | Source: Ambulatory Visit | Attending: Radiation Oncology | Admitting: Radiation Oncology

## 2016-04-07 DIAGNOSIS — Z510 Encounter for antineoplastic radiation therapy: Secondary | ICD-10-CM | POA: Diagnosis not present

## 2016-04-08 ENCOUNTER — Ambulatory Visit: Payer: Medicare Other

## 2016-04-08 ENCOUNTER — Inpatient Hospital Stay: Payer: Medicare Other

## 2016-04-09 ENCOUNTER — Ambulatory Visit: Payer: Medicare Other

## 2016-04-10 ENCOUNTER — Ambulatory Visit: Payer: Medicare Other

## 2016-04-10 ENCOUNTER — Inpatient Hospital Stay: Payer: Medicare Other

## 2016-04-13 ENCOUNTER — Ambulatory Visit
Admission: RE | Admit: 2016-04-13 | Discharge: 2016-04-13 | Disposition: A | Discharge status: Home / Self Care | Payer: Medicare Other | Source: Ambulatory Visit | Attending: Radiation Oncology | Admitting: Radiation Oncology

## 2016-04-13 DIAGNOSIS — Z510 Encounter for antineoplastic radiation therapy: Secondary | ICD-10-CM | POA: Diagnosis not present

## 2016-04-14 ENCOUNTER — Ambulatory Visit
Admission: RE | Admit: 2016-04-14 | Discharge: 2016-04-14 | Disposition: A | Discharge status: Home / Self Care | Payer: Medicare Other | Source: Ambulatory Visit | Attending: Radiation Oncology | Admitting: Radiation Oncology

## 2016-04-14 DIAGNOSIS — Z510 Encounter for antineoplastic radiation therapy: Secondary | ICD-10-CM | POA: Diagnosis not present

## 2016-04-15 ENCOUNTER — Inpatient Hospital Stay: Payer: Medicare Other

## 2016-04-15 ENCOUNTER — Ambulatory Visit
Admission: RE | Admit: 2016-04-15 | Discharge: 2016-04-15 | Disposition: A | Discharge status: Home / Self Care | Payer: Medicare Other | Source: Ambulatory Visit | Attending: Radiation Oncology | Admitting: Radiation Oncology

## 2016-04-15 DIAGNOSIS — Z510 Encounter for antineoplastic radiation therapy: Secondary | ICD-10-CM | POA: Diagnosis not present

## 2016-04-16 ENCOUNTER — Ambulatory Visit
Admission: RE | Admit: 2016-04-16 | Discharge: 2016-04-16 | Disposition: A | Discharge status: Home / Self Care | Payer: Medicare Other | Source: Ambulatory Visit | Attending: Radiation Oncology | Admitting: Radiation Oncology

## 2016-04-16 DIAGNOSIS — Z510 Encounter for antineoplastic radiation therapy: Secondary | ICD-10-CM | POA: Diagnosis not present

## 2016-04-17 ENCOUNTER — Ambulatory Visit: Payer: Medicare Other

## 2016-04-17 DIAGNOSIS — Z510 Encounter for antineoplastic radiation therapy: Secondary | ICD-10-CM | POA: Diagnosis not present

## 2016-04-20 ENCOUNTER — Ambulatory Visit: Payer: Medicare Other

## 2016-04-20 DIAGNOSIS — Z510 Encounter for antineoplastic radiation therapy: Secondary | ICD-10-CM | POA: Diagnosis not present

## 2016-04-21 ENCOUNTER — Ambulatory Visit: Payer: Medicare Other

## 2016-04-21 DIAGNOSIS — Z510 Encounter for antineoplastic radiation therapy: Secondary | ICD-10-CM | POA: Diagnosis not present

## 2016-04-22 ENCOUNTER — Inpatient Hospital Stay: Payer: Medicare Other

## 2016-04-22 ENCOUNTER — Ambulatory Visit
Admission: RE | Admit: 2016-04-22 | Discharge: 2016-04-22 | Disposition: A | Discharge status: Home / Self Care | Payer: Medicare Other | Source: Ambulatory Visit | Attending: Radiation Oncology | Admitting: Radiation Oncology

## 2016-04-22 DIAGNOSIS — Z510 Encounter for antineoplastic radiation therapy: Secondary | ICD-10-CM | POA: Diagnosis not present

## 2016-04-22 DIAGNOSIS — C320 Malignant neoplasm of glottis: Secondary | ICD-10-CM

## 2016-04-22 DIAGNOSIS — C140 Malignant neoplasm of pharynx, unspecified: Secondary | ICD-10-CM | POA: Diagnosis not present

## 2016-04-22 LAB — CBC
HCT: 38.5 % — ABNORMAL LOW (ref 40.0–52.0)
HEMOGLOBIN: 12.6 g/dL — AB (ref 13.0–18.0)
MCH: 26.9 pg (ref 26.0–34.0)
MCHC: 32.7 g/dL (ref 32.0–36.0)
MCV: 82.4 fL (ref 80.0–100.0)
Platelets: 240 10*3/uL (ref 150–440)
RBC: 4.68 MIL/uL (ref 4.40–5.90)
RDW: 18.9 % — ABNORMAL HIGH (ref 11.5–14.5)
WBC: 4.9 10*3/uL (ref 3.8–10.6)

## 2016-04-23 ENCOUNTER — Ambulatory Visit
Admission: RE | Admit: 2016-04-23 | Discharge: 2016-04-23 | Disposition: A | Discharge status: Home / Self Care | Payer: Medicare Other | Source: Ambulatory Visit | Attending: Radiation Oncology | Admitting: Radiation Oncology

## 2016-04-23 DIAGNOSIS — Z510 Encounter for antineoplastic radiation therapy: Secondary | ICD-10-CM | POA: Diagnosis not present

## 2016-04-24 ENCOUNTER — Ambulatory Visit
Admission: RE | Admit: 2016-04-24 | Discharge: 2016-04-24 | Disposition: A | Discharge status: Home / Self Care | Payer: Medicare Other | Source: Ambulatory Visit | Attending: Radiation Oncology | Admitting: Radiation Oncology

## 2016-04-24 DIAGNOSIS — Z510 Encounter for antineoplastic radiation therapy: Secondary | ICD-10-CM | POA: Diagnosis not present

## 2016-04-27 ENCOUNTER — Other Ambulatory Visit: Payer: Self-pay | Admitting: *Deleted

## 2016-04-27 ENCOUNTER — Ambulatory Visit
Admission: RE | Admit: 2016-04-27 | Discharge: 2016-04-27 | Disposition: A | Discharge status: Home / Self Care | Payer: Medicare Other | Source: Ambulatory Visit | Attending: Radiation Oncology | Admitting: Radiation Oncology

## 2016-04-27 DIAGNOSIS — Z510 Encounter for antineoplastic radiation therapy: Secondary | ICD-10-CM | POA: Diagnosis not present

## 2016-04-28 ENCOUNTER — Other Ambulatory Visit: Payer: Self-pay | Admitting: *Deleted

## 2016-04-28 ENCOUNTER — Ambulatory Visit
Admission: RE | Admit: 2016-04-28 | Discharge: 2016-04-28 | Disposition: A | Discharge status: Home / Self Care | Payer: Medicare Other | Source: Ambulatory Visit | Attending: Radiation Oncology | Admitting: Radiation Oncology

## 2016-04-28 DIAGNOSIS — Z510 Encounter for antineoplastic radiation therapy: Secondary | ICD-10-CM | POA: Diagnosis not present

## 2016-04-28 MED ORDER — DEXAMETHASONE 4 MG PO TABS: 4.0000 mg | ORAL_TABLET | Freq: Every day | ORAL | 0 refills | Status: DC

## 2016-04-28 MED ORDER — SUCRALFATE 1 G PO TABS: 1.0000 g | ORAL_TABLET | Freq: Three times a day (TID) | ORAL | 3 refills | Status: DC

## 2016-04-29 ENCOUNTER — Inpatient Hospital Stay: Payer: Medicare Other | Attending: Radiation Oncology

## 2016-04-29 ENCOUNTER — Ambulatory Visit
Admission: RE | Admit: 2016-04-29 | Discharge: 2016-04-29 | Disposition: A | Discharge status: Home / Self Care | Payer: Medicare Other | Source: Ambulatory Visit | Attending: Radiation Oncology | Admitting: Radiation Oncology

## 2016-04-29 DIAGNOSIS — C140 Malignant neoplasm of pharynx, unspecified: Secondary | ICD-10-CM | POA: Insufficient documentation

## 2016-04-29 DIAGNOSIS — C320 Malignant neoplasm of glottis: Secondary | ICD-10-CM

## 2016-04-29 DIAGNOSIS — Z510 Encounter for antineoplastic radiation therapy: Secondary | ICD-10-CM | POA: Diagnosis not present

## 2016-04-29 LAB — CBC
HCT: 37.8 % — ABNORMAL LOW (ref 40.0–52.0)
Hemoglobin: 12.6 g/dL — ABNORMAL LOW (ref 13.0–18.0)
MCH: 27.3 pg (ref 26.0–34.0)
MCHC: 33.3 g/dL (ref 32.0–36.0)
MCV: 81.9 fL (ref 80.0–100.0)
Platelets: 215 10*3/uL (ref 150–440)
RBC: 4.61 MIL/uL (ref 4.40–5.90)
RDW: 18.1 % — AB (ref 11.5–14.5)
WBC: 5.6 10*3/uL (ref 3.8–10.6)

## 2016-04-30 ENCOUNTER — Ambulatory Visit
Admission: RE | Admit: 2016-04-30 | Discharge: 2016-04-30 | Disposition: A | Discharge status: Home / Self Care | Payer: Medicare Other | Source: Ambulatory Visit | Attending: Radiation Oncology | Admitting: Radiation Oncology

## 2016-04-30 DIAGNOSIS — Z510 Encounter for antineoplastic radiation therapy: Secondary | ICD-10-CM | POA: Diagnosis not present

## 2016-05-01 ENCOUNTER — Inpatient Hospital Stay: Payer: Medicare Other

## 2016-05-01 ENCOUNTER — Ambulatory Visit
Admission: RE | Admit: 2016-05-01 | Discharge: 2016-05-01 | Disposition: A | Discharge status: Home / Self Care | Payer: Medicare Other | Source: Ambulatory Visit | Attending: Radiation Oncology | Admitting: Radiation Oncology

## 2016-05-01 DIAGNOSIS — Z510 Encounter for antineoplastic radiation therapy: Secondary | ICD-10-CM | POA: Diagnosis not present

## 2016-05-04 ENCOUNTER — Inpatient Hospital Stay: Payer: Medicare Other

## 2016-05-04 ENCOUNTER — Ambulatory Visit
Admission: RE | Admit: 2016-05-04 | Discharge: 2016-05-04 | Disposition: A | Discharge status: Home / Self Care | Payer: Medicare Other | Source: Ambulatory Visit | Attending: Radiation Oncology | Admitting: Radiation Oncology

## 2016-05-04 DIAGNOSIS — Z510 Encounter for antineoplastic radiation therapy: Secondary | ICD-10-CM | POA: Diagnosis not present

## 2016-05-04 NOTE — Progress Notes (Signed)
Nutrition Assessment   Reason for Assessment:   Referral from Dr Baruch Gouty regarding swallowing problems due to radiation  treatment of larynx  ASSESSMENT:  76 year old male with stage I squamous cell carcinoma of larynx currently receiving radiation treatment.  Past medical history of prostate cancer, CHF, COPD, CAD, etoh use, smoker, HLD, HTN, MI  Patient reports he has a good appetite now.  Reports about 1 week ago was not able to eat much because couldn't swallow.  "I could only get down oatmeal about 1 week ago." Reports now that he eats oatmeal in the morning with chopped hot dog in it and coffee. Reports does not eat much lunch but snacks on chips and peanut butter crackers during the day. Drinks 6 beers per day. For supper eats chicken noodle soup.  Sister helps him with preparing food and buying food.  Reports that he is drinking ensure original 1 time per day.  Reports does not have any teeth that he went to a bad dentist that pulled all his teeth. The dentures that were made for him cause him gum pain so does not use them.  Reports no trouble chewing meats at this time.  Report no problems with nausea or vomiting. Reports no problems with diarrhea or constipation. Reports no mouth pain or sores in mouth.  Nutrition Focused Physical Exam: Nutrition-Focused physical exam completed. Findings are severe fat depletion, moderate to severe (thigh, hand, temple area only, limited exam) muscle depletion, and none edema.   Patient reports MD in 2002 told him he didn't have much muscle mass in 2002.  Medications: Vit D, Mag ox, protonix  Labs: hgb 12.6  Anthropometrics:   Height: 70 inches Weight: 145 lb UBW: 142 lb per patient over the last few years.  Patient reports weighed 180 pounds in 2002 when had heart attack.   BMI: 20.8  Noted weight in EPIC in 09/07/2014 of 132 pounds  Estimated Energy Needs  Kcals: 1900-2300 calories/d Protein: 79-99 g of protein Fluid: 2.3  L/d  NUTRITION DIAGNOSIS: Dysphagia related to cancer related treatment as evidenced by limiting intake to soft liquid foods.   MALNUTRITION DIAGNOSIS: none at this time   INTERVENTION:   Recommend SLP evaluation secondary to patient receiving radiation treatment to larynx and evaluate for aspiration risk. Message sent to Dr. Donella Stade. Recommend ensure plus/ensure enlive or boost plus vs ensure original to provide additional 350-360 calories in 8 oz serving.  Encouraged patient to drink 3 per day.  May need to increase if weight decreases. Provided samples of both products and coupons. Discussed soft, moist high calorie, high protein foods to assist with dysphagia and no teeth.     MONITORING, EVALUATION, GOAL: patient to consume adequate calories and protein to prevent weight loss   NEXT VISIT: Feb 19th after radiation treatment for weight check and check oral intake  Dameian Crisman [name], [place], [place] (pager)

## 2016-05-05 ENCOUNTER — Ambulatory Visit
Admission: RE | Admit: 2016-05-05 | Discharge: 2016-05-05 | Disposition: A | Discharge status: Home / Self Care | Payer: Medicare Other | Source: Ambulatory Visit | Attending: Radiation Oncology | Admitting: Radiation Oncology

## 2016-05-05 DIAGNOSIS — Z510 Encounter for antineoplastic radiation therapy: Secondary | ICD-10-CM | POA: Diagnosis not present

## 2016-05-06 ENCOUNTER — Ambulatory Visit
Admission: RE | Admit: 2016-05-06 | Discharge: 2016-05-06 | Disposition: A | Discharge status: Home / Self Care | Payer: Medicare Other | Source: Ambulatory Visit | Attending: Radiation Oncology | Admitting: Radiation Oncology

## 2016-05-06 ENCOUNTER — Inpatient Hospital Stay: Payer: Medicare Other

## 2016-05-06 DIAGNOSIS — C140 Malignant neoplasm of pharynx, unspecified: Secondary | ICD-10-CM | POA: Diagnosis not present

## 2016-05-06 DIAGNOSIS — C320 Malignant neoplasm of glottis: Secondary | ICD-10-CM

## 2016-05-06 DIAGNOSIS — Z510 Encounter for antineoplastic radiation therapy: Secondary | ICD-10-CM | POA: Diagnosis not present

## 2016-05-06 LAB — CBC
HCT: 36.9 % — ABNORMAL LOW (ref 40.0–52.0)
Hemoglobin: 12.2 g/dL — ABNORMAL LOW (ref 13.0–18.0)
MCH: 27 pg (ref 26.0–34.0)
MCHC: 33 g/dL (ref 32.0–36.0)
MCV: 81.8 fL (ref 80.0–100.0)
PLATELETS: 275 10*3/uL (ref 150–440)
RBC: 4.51 MIL/uL (ref 4.40–5.90)
RDW: 17.9 % — AB (ref 11.5–14.5)
WBC: 11.5 10*3/uL — AB (ref 3.8–10.6)

## 2016-05-07 ENCOUNTER — Ambulatory Visit
Admission: RE | Admit: 2016-05-07 | Discharge: 2016-05-07 | Disposition: A | Discharge status: Home / Self Care | Payer: Medicare Other | Source: Ambulatory Visit | Attending: Radiation Oncology | Admitting: Radiation Oncology

## 2016-05-07 DIAGNOSIS — Z510 Encounter for antineoplastic radiation therapy: Secondary | ICD-10-CM | POA: Diagnosis not present

## 2016-05-08 ENCOUNTER — Ambulatory Visit
Admission: RE | Admit: 2016-05-08 | Discharge: 2016-05-08 | Disposition: A | Discharge status: Home / Self Care | Payer: Medicare Other | Source: Ambulatory Visit | Attending: Radiation Oncology | Admitting: Radiation Oncology

## 2016-05-08 DIAGNOSIS — Z510 Encounter for antineoplastic radiation therapy: Secondary | ICD-10-CM | POA: Diagnosis not present

## 2016-05-11 ENCOUNTER — Inpatient Hospital Stay: Payer: Medicare Other

## 2016-05-11 ENCOUNTER — Ambulatory Visit
Admission: RE | Admit: 2016-05-11 | Discharge: 2016-05-11 | Disposition: A | Discharge status: Home / Self Care | Payer: Medicare Other | Source: Ambulatory Visit | Attending: Radiation Oncology | Admitting: Radiation Oncology

## 2016-05-11 DIAGNOSIS — Z510 Encounter for antineoplastic radiation therapy: Secondary | ICD-10-CM | POA: Diagnosis not present

## 2016-05-11 NOTE — Progress Notes (Signed)
Nutrition Follow-up:  Patient did not show up for nutrition appointment today following radiation.    Henry Mcmahon [name], [place], [place] (pager)

## 2016-05-12 ENCOUNTER — Ambulatory Visit
Admission: RE | Admit: 2016-05-12 | Discharge: 2016-05-12 | Disposition: A | Discharge status: Home / Self Care | Payer: Medicare Other | Source: Ambulatory Visit | Attending: Radiation Oncology | Admitting: Radiation Oncology

## 2016-05-12 DIAGNOSIS — Z510 Encounter for antineoplastic radiation therapy: Secondary | ICD-10-CM | POA: Diagnosis not present

## 2016-05-13 ENCOUNTER — Ambulatory Visit
Admission: RE | Admit: 2016-05-13 | Discharge: 2016-05-13 | Disposition: A | Discharge status: Home / Self Care | Payer: Medicare Other | Source: Ambulatory Visit | Attending: Radiation Oncology | Admitting: Radiation Oncology

## 2016-05-13 DIAGNOSIS — Z510 Encounter for antineoplastic radiation therapy: Secondary | ICD-10-CM | POA: Diagnosis not present

## 2016-05-14 ENCOUNTER — Ambulatory Visit
Admission: RE | Admit: 2016-05-14 | Discharge: 2016-05-14 | Disposition: A | Discharge status: Home / Self Care | Payer: Medicare Other | Source: Ambulatory Visit | Attending: Radiation Oncology | Admitting: Radiation Oncology

## 2016-05-14 ENCOUNTER — Ambulatory Visit: Payer: Medicare Other

## 2016-05-14 DIAGNOSIS — Z510 Encounter for antineoplastic radiation therapy: Secondary | ICD-10-CM | POA: Diagnosis not present

## 2016-05-14 NOTE — Progress Notes (Signed)
Nutrition Follow-up:  Briefly met with patient following radiation therapy today.    Patient reports that his swallowing is better and that he is eating.  Reports that his sister is getting him ensure to drink.    Final radiation treatment on 2/27.     Anthropometrics:   Weight measured today in radiation and 145 pounds, same weight noted on 2/12.      NUTRITION DIAGNOSIS: Dysphagia related to cancer related treatment improving per pt   MALNUTRITION DIAGNOSIS: none at this time   INTERVENTION:   Encouraged patient to continue to drink ensure/boost plus products at least 3 per day.  Additional samples provided today. Recommend SLP evaluation to evaluate aspiration risk.  Message sent to Dr Baruch Gouty on initial evaluation.       MONITORING, EVALUATION, GOAL: Patient to consume adequate calories and protein to prevent weight loss.   NEXT VISIT:  As needed  Dorris Vangorder [name], [place], [place] Registered Dietitian [phone] (pager)

## 2016-05-15 ENCOUNTER — Ambulatory Visit
Admission: RE | Admit: 2016-05-15 | Discharge: 2016-05-15 | Disposition: A | Discharge status: Home / Self Care | Payer: Medicare Other | Source: Ambulatory Visit | Attending: Radiation Oncology | Admitting: Radiation Oncology

## 2016-05-15 ENCOUNTER — Ambulatory Visit: Payer: Medicare Other

## 2016-05-15 DIAGNOSIS — Z510 Encounter for antineoplastic radiation therapy: Secondary | ICD-10-CM | POA: Diagnosis not present

## 2016-05-18 ENCOUNTER — Ambulatory Visit
Admission: RE | Admit: 2016-05-18 | Discharge: 2016-05-18 | Disposition: A | Discharge status: Home / Self Care | Payer: Medicare Other | Source: Ambulatory Visit | Attending: Radiation Oncology | Admitting: Radiation Oncology

## 2016-05-18 DIAGNOSIS — Z510 Encounter for antineoplastic radiation therapy: Secondary | ICD-10-CM | POA: Diagnosis not present

## 2016-05-19 ENCOUNTER — Ambulatory Visit: Payer: Medicare Other

## 2016-05-19 ENCOUNTER — Ambulatory Visit
Admission: RE | Admit: 2016-05-19 | Discharge: 2016-05-19 | Disposition: A | Discharge status: Home / Self Care | Payer: Medicare Other | Source: Ambulatory Visit | Attending: Radiation Oncology | Admitting: Radiation Oncology

## 2016-05-20 DIAGNOSIS — Z510 Encounter for antineoplastic radiation therapy: Secondary | ICD-10-CM | POA: Diagnosis not present

## 2016-06-11 ENCOUNTER — Encounter: Payer: Self-pay | Admitting: Radiation Oncology

## 2016-06-11 ENCOUNTER — Other Ambulatory Visit: Payer: Self-pay | Admitting: *Deleted

## 2016-06-11 ENCOUNTER — Ambulatory Visit
Admission: RE | Admit: 2016-06-11 | Discharge: 2016-06-11 | Disposition: A | Discharge status: Home / Self Care | Payer: Medicare Other | Source: Ambulatory Visit | Attending: Radiation Oncology | Admitting: Radiation Oncology

## 2016-06-11 VITALS — BP 151/91 | HR 120 | Temp 96.2°F | Resp 20 | Wt 142.3 lb

## 2016-06-11 DIAGNOSIS — F17210 Nicotine dependence, cigarettes, uncomplicated: Secondary | ICD-10-CM | POA: Insufficient documentation

## 2016-06-11 DIAGNOSIS — C329 Malignant neoplasm of larynx, unspecified: Secondary | ICD-10-CM

## 2016-06-11 DIAGNOSIS — R490 Dysphonia: Secondary | ICD-10-CM | POA: Diagnosis not present

## 2016-06-11 DIAGNOSIS — Z923 Personal history of irradiation: Secondary | ICD-10-CM | POA: Diagnosis not present

## 2016-06-11 DIAGNOSIS — C320 Malignant neoplasm of glottis: Secondary | ICD-10-CM

## 2016-06-11 NOTE — Progress Notes (Signed)
Radiation Oncology Follow up Note  Name: Henry Mcmahon   Date:   06/11/2016 MRN:  [id number] DOB: [birth year]    This 76 y.o. male presents to the clinic today for one-month follow-up status post external beam radiation therapy for stage I squamous cell carcinoma the larynx.  REFERRING PROVIDER: No ref. provider found  HPI: Patient is a 76 year old male known to our department having been treated back in 2009 for stage II adenocarcinoma the prostate for which he is done well. He is now 1 month out external beam radiation therapy to his larynx for presumed stage I squamous cell carcinoma. He is seen today in routine follow-up still is having some poor tonality of his voice. He specifically denies head and neck pain or dysphagia. He continues to smoke..  COMPLICATIONS OF TREATMENT: none  FOLLOW UP COMPLIANCE: keeps appointments   PHYSICAL EXAM:  BP (!) 151/91   Pulse (!) 120   Temp (!) 96.2 F (35.7 C)   Resp 20   Wt 142 lb 4.9 oz (64.5 kg)   BMI 20.42 kg/m  Oral cavity is clear no oral mucosal lesions are identified indirect mirror examination shows upper airway clear some significant edema in the larynx is noted. No evidence of mass or nodularity is identified vallecula and base of tongue within normal limits. Neck is clear without evidence of subject gastric cervical or supraclavicular adenopathy. Well-developed well-nourished patient in NAD. HEENT reveals PERLA, EOMI, discs not visualized.  Oral cavity is clear. No oral mucosal lesions are identified. Neck is clear without evidence of cervical or supraclavicular adenopathy. Lungs are clear to A&P. Cardiac examination is essentially unremarkable with regular rate and rhythm without murmur rub or thrill. Abdomen is benign with no organomegaly or masses noted. Motor sensory and DTR levels are equal and symmetric in the upper and lower extremities. Cranial nerves II through XII are grossly intact. Proprioception is intact. No peripheral  adenopathy or edema is identified. No motor or sensory levels are noted. Crude visual fields are within normal range.  RADIOLOGY RESULTS: CT scan with contrast of the neck has been ordered prior to his next follow-up visit  PLAN: Present time patient is doing well I've assured him a will slowly resume normal tonality of his voice. I've also discussed with him the problems with continued smoking and its effect on his larynx. I've asked to see him back in 3 months for follow-up and have ordered a CT scan with contrast of his neck prior to that visit. I've also scheduled a follow-up appointment with Dr. Richardson Mcmahon. Patient knows to call with any concerns.  I would like to take this opportunity to thank you for allowing me to participate in the care of your patient.Henry Mcmahon., MD

## 2016-09-07 ENCOUNTER — Other Ambulatory Visit: Payer: Self-pay | Admitting: *Deleted

## 2016-09-07 DIAGNOSIS — C320 Malignant neoplasm of glottis: Secondary | ICD-10-CM

## 2016-09-08 ENCOUNTER — Ambulatory Visit
Admission: RE | Admit: 2016-09-08 | Discharge: 2016-09-08 | Disposition: A | Discharge status: Home / Self Care | Payer: Medicare Other | Source: Ambulatory Visit | Attending: Radiation Oncology | Admitting: Radiation Oncology

## 2016-09-08 DIAGNOSIS — Z923 Personal history of irradiation: Secondary | ICD-10-CM | POA: Insufficient documentation

## 2016-09-08 DIAGNOSIS — C329 Malignant neoplasm of larynx, unspecified: Secondary | ICD-10-CM | POA: Insufficient documentation

## 2016-09-08 DIAGNOSIS — Z950 Presence of cardiac pacemaker: Secondary | ICD-10-CM | POA: Insufficient documentation

## 2016-09-08 DIAGNOSIS — J439 Emphysema, unspecified: Secondary | ICD-10-CM | POA: Diagnosis not present

## 2016-09-08 DIAGNOSIS — J320 Chronic maxillary sinusitis: Secondary | ICD-10-CM | POA: Insufficient documentation

## 2016-09-08 DIAGNOSIS — I2510 Atherosclerotic heart disease of native coronary artery without angina pectoris: Secondary | ICD-10-CM | POA: Insufficient documentation

## 2016-09-08 DIAGNOSIS — C320 Malignant neoplasm of glottis: Secondary | ICD-10-CM

## 2016-09-08 DIAGNOSIS — I6523 Occlusion and stenosis of bilateral carotid arteries: Secondary | ICD-10-CM | POA: Insufficient documentation

## 2016-09-08 DIAGNOSIS — Z951 Presence of aortocoronary bypass graft: Secondary | ICD-10-CM | POA: Diagnosis not present

## 2016-09-08 LAB — CBC
HEMATOCRIT: 40.6 % (ref 40.0–52.0)
HEMOGLOBIN: 13.4 g/dL (ref 13.0–18.0)
MCH: 28.8 pg (ref 26.0–34.0)
MCHC: 33 g/dL (ref 32.0–36.0)
MCV: 87.5 fL (ref 80.0–100.0)
Platelets: 256 10*3/uL (ref 150–440)
RBC: 4.64 MIL/uL (ref 4.40–5.90)
RDW: 18.9 % — ABNORMAL HIGH (ref 11.5–14.5)
WBC: 6.3 10*3/uL (ref 3.8–10.6)

## 2016-09-08 LAB — CREATININE, SERUM: CREATININE: 0.84 mg/dL (ref 0.61–1.24)

## 2016-09-08 MED ORDER — IOPAMIDOL (ISOVUE-300) INJECTION 61%
75.0000 mL | Freq: Once | INTRAVENOUS | Status: AC | PRN
Start: 2016-09-08 — End: 2016-09-08
  Administered 2016-09-08: 75 mL via INTRAVENOUS

## 2016-09-15 ENCOUNTER — Encounter: Payer: Self-pay | Admitting: Radiation Oncology

## 2016-09-15 ENCOUNTER — Ambulatory Visit
Admission: RE | Admit: 2016-09-15 | Discharge: 2016-09-15 | Disposition: A | Discharge status: Home / Self Care | Payer: Medicare Other | Source: Ambulatory Visit | Attending: Radiation Oncology | Admitting: Radiation Oncology

## 2016-09-15 VITALS — BP 154/91 | HR 106 | Temp 98.0°F | Wt 132.8 lb

## 2016-09-15 DIAGNOSIS — F17210 Nicotine dependence, cigarettes, uncomplicated: Secondary | ICD-10-CM | POA: Diagnosis not present

## 2016-09-15 DIAGNOSIS — Z923 Personal history of irradiation: Secondary | ICD-10-CM | POA: Diagnosis not present

## 2016-09-15 DIAGNOSIS — C320 Malignant neoplasm of glottis: Secondary | ICD-10-CM | POA: Insufficient documentation

## 2016-09-15 NOTE — Progress Notes (Signed)
Radiation Oncology Follow up Note  Name: Henry Mcmahon   Date:   09/15/2016 MRN:  [id number] DOB: Jan 11, 1941    This 76 y.o. male presents to the clinic today for four-month follow-up status post radiation therapy for stage I squamous cell carcinoma the larynx.  REFERRING PROVIDER: No ref. provider found  HPI: Patient is a 76 year old male now out 4 months having completed external beam radiation therapy for stage I squamous cell carcinoma the larynx. He is seen today in routine follow-up. He states his throat is sometimes still sore in his voice is raspy. He continues to smoke. We made follow-up appointments with ENT although the patient does not recall seeing ENT. We've made another appointment with Dr. Richardson Landry today. He specifically denies head and neck pain or significant dysphagia.  COMPLICATIONS OF TREATMENT: none  FOLLOW UP COMPLIANCE: keeps appointments   PHYSICAL EXAM:  BP (!) 154/91   Pulse (!) 106   Temp 98 F (36.7 C)   Wt 132 lb 13.2 oz (60.2 kg)   BMI 19.06 kg/m  Oral cavity is clear. Indirect mirror examination shows some's edema of the larynx cords are mobile. No evidence of mass or nodularity is noted. Vallecula and base of tongue within normal limits. Neck is clear without evidence of subject gastric cervical or supraclavicular adenopathy. Well-developed well-nourished patient in NAD. HEENT reveals PERLA, EOMI, discs not visualized.  Oral cavity is clear. No oral mucosal lesions are identified. Neck is clear without evidence of cervical or supraclavicular adenopathy. Lungs are clear to A&P. Cardiac examination is essentially unremarkable with regular rate and rhythm without murmur rub or thrill. Abdomen is benign with no organomegaly or masses noted. Motor sensory and DTR levels are equal and symmetric in the upper and lower extremities. Cranial nerves II through XII are grossly intact. Proprioception is intact. No peripheral adenopathy or edema is identified. No motor or  sensory levels are noted. Crude visual fields are within normal range.  RADIOLOGY RESULTS: No current films for review  PLAN: Again at the present time I have set up and made arrangements for him to be seen by ENT so he can have a complete endoscopy. I've asked to see the patient back in 6 months for follow-up. I've again explained to him the necessity to cut down at least or quit smoking. Patient knows to call with any concerns.  I would like to take this opportunity to thank you for allowing me to participate in the care of your patient.Armstead Peaks., MD

## 2017-04-05 ENCOUNTER — Encounter: Payer: Self-pay | Admitting: Radiation Oncology

## 2017-04-05 ENCOUNTER — Other Ambulatory Visit: Payer: Self-pay

## 2017-04-05 ENCOUNTER — Ambulatory Visit
Admission: RE | Admit: 2017-04-05 | Discharge: 2017-04-05 | Disposition: A | Discharge status: Home / Self Care | Payer: Medicare Other | Source: Ambulatory Visit | Attending: Radiation Oncology | Admitting: Radiation Oncology

## 2017-04-05 VITALS — BP 124/87 | HR 82 | Temp 96.7°F | Resp 20 | Wt 137.3 lb

## 2017-04-05 DIAGNOSIS — Z923 Personal history of irradiation: Secondary | ICD-10-CM | POA: Insufficient documentation

## 2017-04-05 DIAGNOSIS — C320 Malignant neoplasm of glottis: Secondary | ICD-10-CM | POA: Insufficient documentation

## 2017-04-05 DIAGNOSIS — Z9119 Patient's noncompliance with other medical treatment and regimen: Secondary | ICD-10-CM | POA: Insufficient documentation

## 2017-04-05 DIAGNOSIS — F17210 Nicotine dependence, cigarettes, uncomplicated: Secondary | ICD-10-CM | POA: Diagnosis not present

## 2017-04-05 NOTE — Progress Notes (Signed)
Radiation Oncology Follow up Note  Name: Henry Mcmahon   Date:   04/05/2017 MRN:  [id number] DOB: Sep 29, 1940    This 77 y.o. male presents to the clinic today for 10 month follow-up status post external radiation therapy for stage I squamous cell carcinoma the larynx.  REFERRING PROVIDER: No ref. provider found  HPI: Patient is a 77 year old male now out 10 months having completed external beam radiation therapy to his larynx for stage I squamous cell carcinoma. He has been noncompliant in follow-up with ENT and is a difficult head and neck exam. He specifically denies head and neck pain or dysphagia. He does describe it tickle in his throat at night.. He did have a CT scan performed in June showing soft tissue thickening in the region of the larynx improved compared to February study otherwise negative for any soft tissue masses or adenopathy.  COMPLICATIONS OF TREATMENT: none  FOLLOW UP COMPLIANCE: keeps appointments   PHYSICAL EXAM:  BP 124/87   Pulse 82   Temp (!) 96.7 F (35.9 C)   Resp 20   Wt 137 lb 5.6 oz (62.3 kg)   BMI 19.71 kg/m  Oral cavity is clear no oral mucosal lesions are identified. Patient is noncompliant for indirect mirror examination neck is clear without evidence of subject gastric cervical or supraclavicular adenopathy  RADIOLOGY RESULTS: Previous CT scan from June is reviewed and compatible with the above-stated findings  PLAN: At this time I am trying to arrange a follow-up appoint with ENT for the patient can undergo a direct nasopharyngoscopy. His head and neck exam is extremely difficult and he has been noncompliant with several appointments. I will try to turn follow-up care over to ENT if we can establish that appointment. Patient was given this information and will be called with his follow-up appointment with ENT. Patient knows to call should he have any problems with his follow-up visits.  I would like to take this opportunity to thank you for allowing  me to participate in the care of your patient.Noreene Filbert, MD

## 2017-08-31 DIAGNOSIS — E782 Mixed hyperlipidemia: Secondary | ICD-10-CM | POA: Insufficient documentation

## 2018-05-12 IMAGING — CT CT NECK W/ CM
4 of 5 series · 16 of 35 positions shown, 18 images · IV contrast (iopamidol)
Comparison: None.

CLINICAL DATA: Squamous cell carcinoma right vocal cord. History of
prostate cancer

EXAM:
CT NECK WITH CONTRAST
TECHNIQUE: Multidetector CT imaging of the neck was performed using the
standard protocol following the bolus administration of intravenous
contrast.
CONTRAST:  75mL 9XQQYI-K00 IOPAMIDOL ([id number]-K00) INJECTION 61%

[Series 2: axial neck · axial · 0.67mm/px · z∈[-357,-203]mm · 4 of 127 slices shown]
[im 26/127  bone]
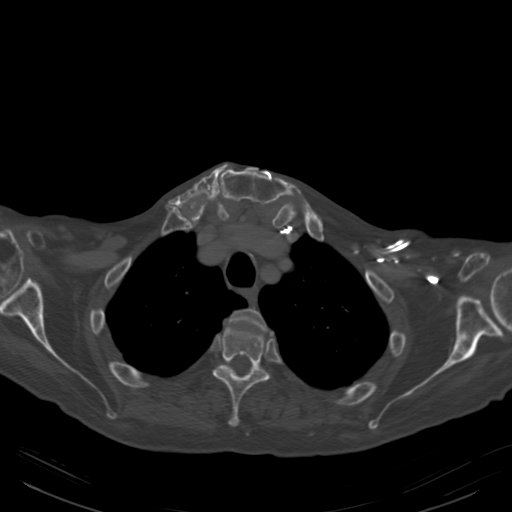
[im 51/127  bone]
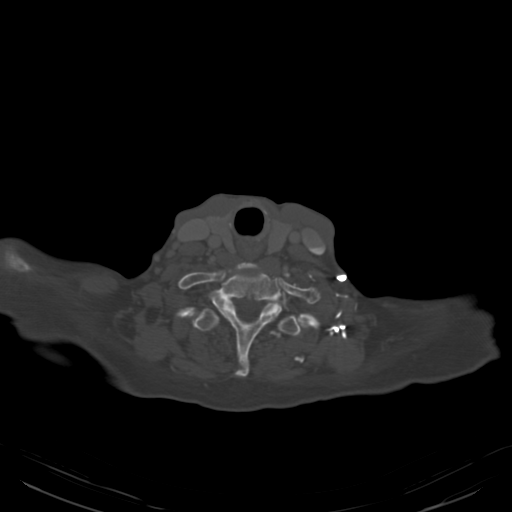
[im 76/127  bone]
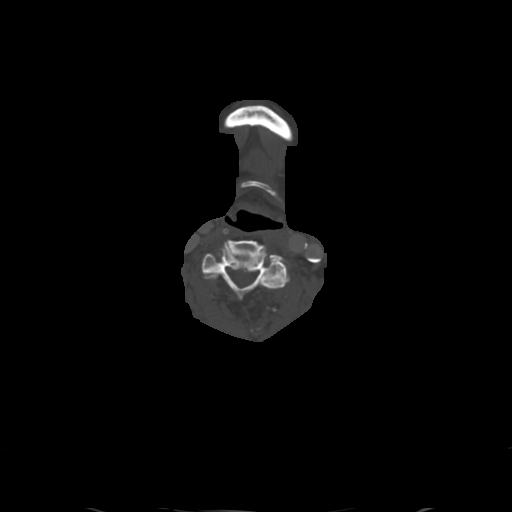
[im 101/127  bone]
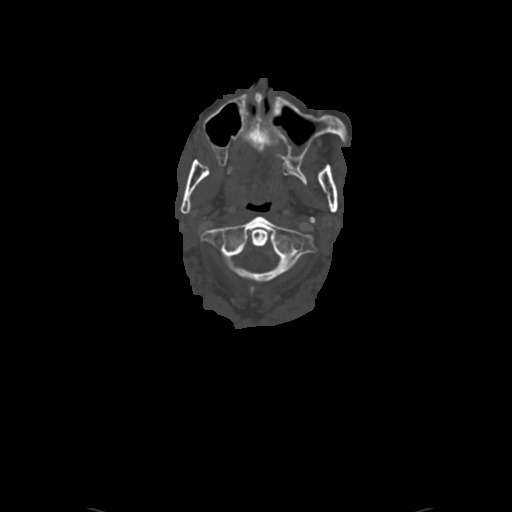

[Series 602: coronal · coronal · 0.67mm/px · 3 of 109 slices shown]
[im 32/109  bone]
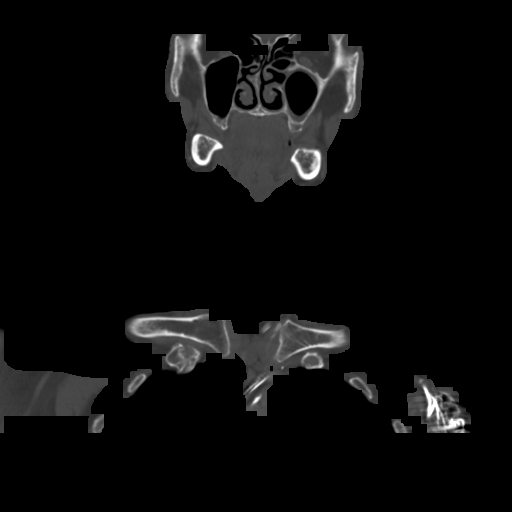
[im 47/109  bone]
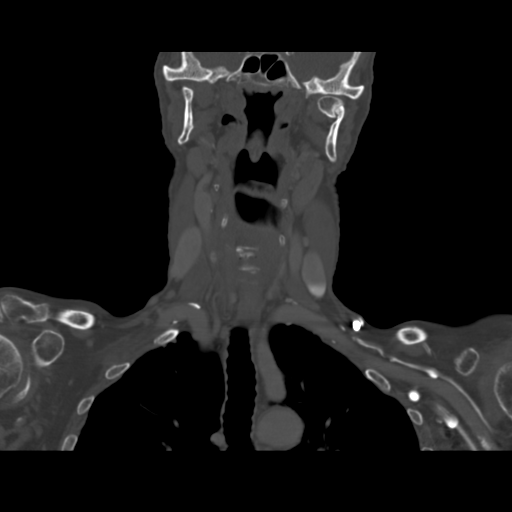
[im 62/109  bone]
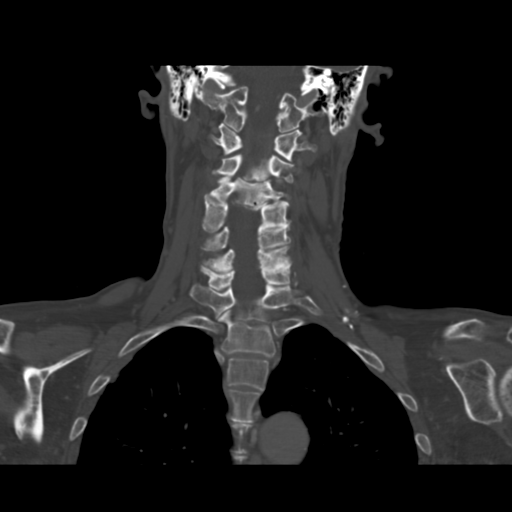

[Series 603: sagittal · sagittal · 0.67mm/px · 5 of 124 slices shown, 6 images]
[im 42/124  bone]
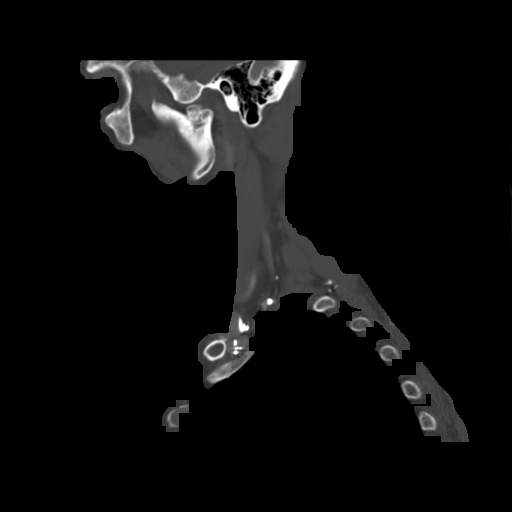
[im 52/124  bone]
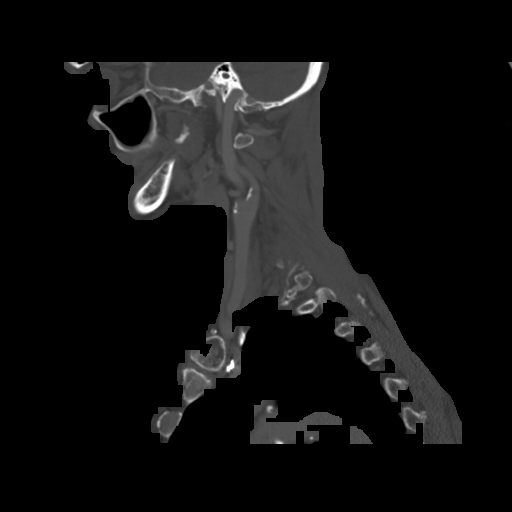
[im 62/124  soft-tissue]
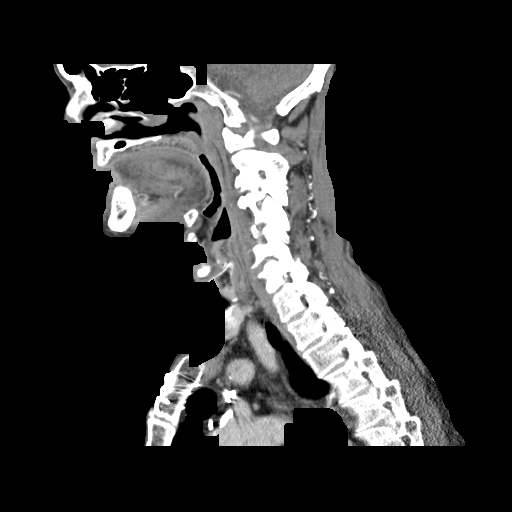
[im 62/124  bone]
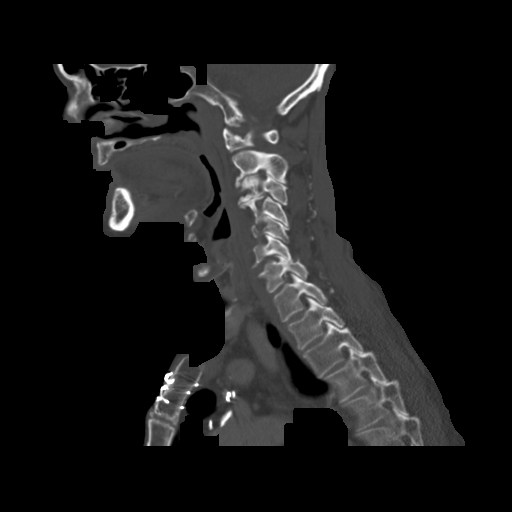
[im 72/124  bone]
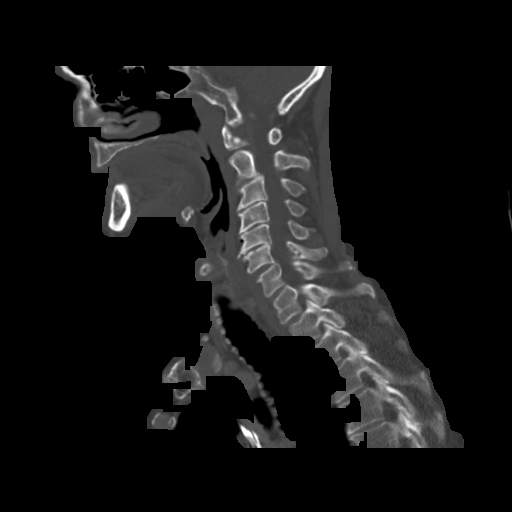
[im 83/124  bone]
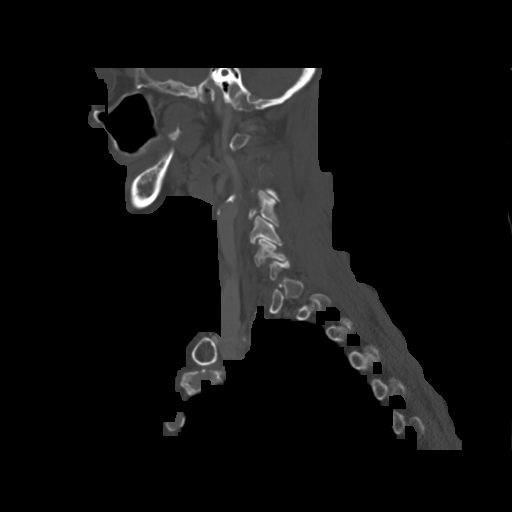

[Series 604: oropharynx · axial · 0.67mm/px · z∈[-416,-240]mm · 4 of 157 slices shown, 5 images]
[im 32/157  soft-tissue]
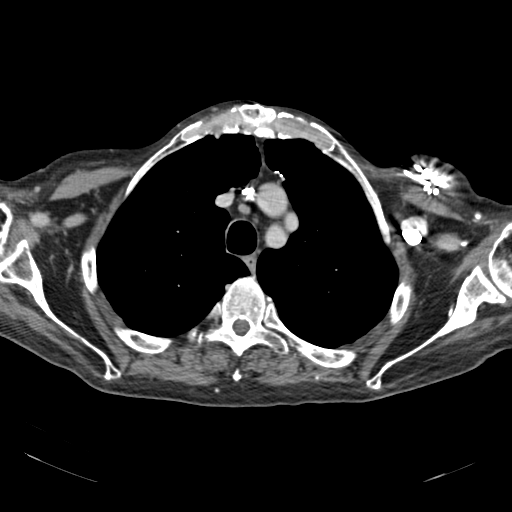
[im 32/157  bone]
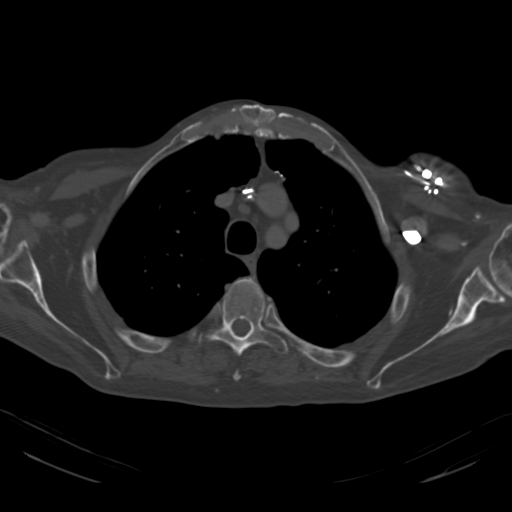
[im 63/157  bone]
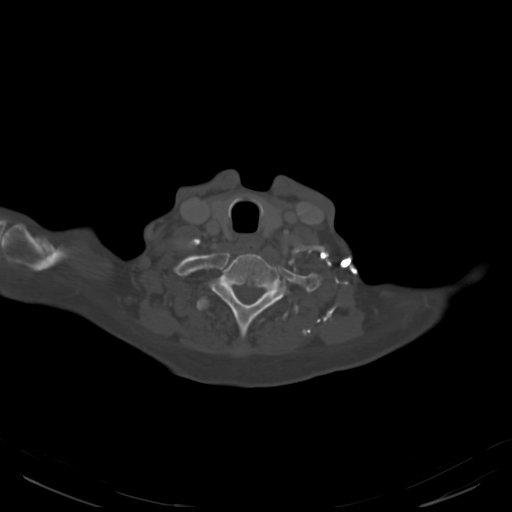
[im 94/157  bone]
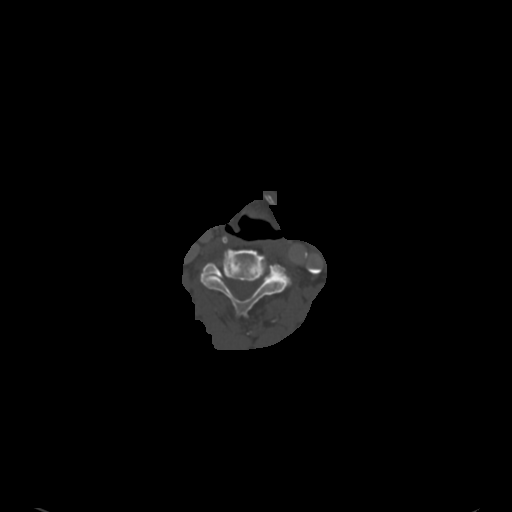
[im 125/157  bone]
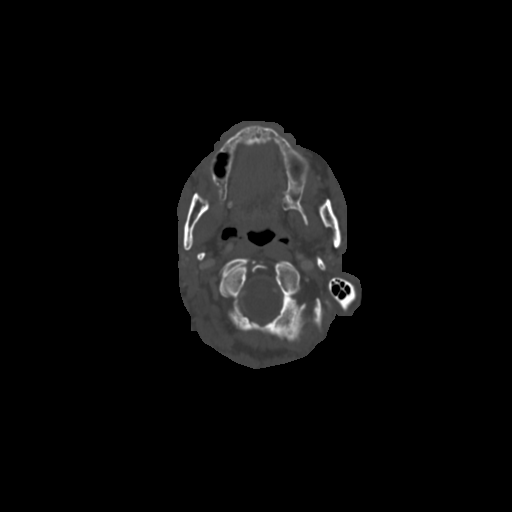

[16 of 35 positions shown; findings below may reference images not displayed]

FINDINGS: Pharynx and larynx: The nasopharynx and oropharynx are normal.
Epiglottis normal. Mild thickening of the anterior vocal cord on the
right compatible with biopsy-proven tumor. Tumor margins not well
defined and difficult to measure. Approximate 1 cm tumor. There
appears to be invasion of the periglottic fat extending to the
thyroid cartilage. No definite thyroid cartilage invasion.

Salivary glands: Negative

Thyroid: Negative

Lymph nodes: No pathologic or enlarged lymph nodes.

Vascular: Carotid artery calcification bilaterally without
significant carotid stenosis. Jugular vein patent bilaterally.

Limited intracranial: Negative

Visualized orbits: Negative

Mastoids and visualized paranasal sinuses: Mild mucosal edema in the
left maxillary sinus. Remaining sinuses clear.

Skeleton: Cervical spine degenerative change and spondylosis CT
through C6. No fracture or metastatic disease. Chronic fracture of
left mandibular condyle with angulation and nonunion. Dislocation of
the condyle anteriorly.

Upper chest: Emphysema and subpleural cysts.  No apical mass lesion.

Other: None
IMPRESSION: Ill-defined mass in the right anterior vocal cord compatible with
biopsy-proven tumor. Tumor appears to extend to the thyroid
cartilage without definite invasion of the cartilage. No pathologic
adenopathy in the neck.

Chronic fracture left mandibular condyle with dislocation of the
condyle and nonunion of the fracture.

## 2019-12-04 ENCOUNTER — Ambulatory Visit (INDEPENDENT_AMBULATORY_CARE_PROVIDER_SITE_OTHER): Payer: Medicare Other | Admitting: Vascular Surgery

## 2019-12-04 ENCOUNTER — Encounter (INDEPENDENT_AMBULATORY_CARE_PROVIDER_SITE_OTHER): Payer: Self-pay | Admitting: Vascular Surgery

## 2019-12-04 ENCOUNTER — Other Ambulatory Visit: Payer: Self-pay

## 2019-12-04 VITALS — BP 169/83 | HR 87 | Ht 70.0 in | Wt 129.0 lb

## 2019-12-04 DIAGNOSIS — I10 Essential (primary) hypertension: Secondary | ICD-10-CM

## 2019-12-04 DIAGNOSIS — J449 Chronic obstructive pulmonary disease, unspecified: Secondary | ICD-10-CM | POA: Diagnosis not present

## 2019-12-04 DIAGNOSIS — I70213 Atherosclerosis of native arteries of extremities with intermittent claudication, bilateral legs: Secondary | ICD-10-CM

## 2019-12-04 DIAGNOSIS — R0989 Other specified symptoms and signs involving the circulatory and respiratory systems: Secondary | ICD-10-CM | POA: Diagnosis not present

## 2019-12-04 DIAGNOSIS — I25118 Atherosclerotic heart disease of native coronary artery with other forms of angina pectoris: Secondary | ICD-10-CM

## 2019-12-04 DIAGNOSIS — Z4502 Encounter for adjustment and management of automatic implantable cardiac defibrillator: Secondary | ICD-10-CM | POA: Insufficient documentation

## 2019-12-04 DIAGNOSIS — M5136 Other intervertebral disc degeneration, lumbar region: Secondary | ICD-10-CM

## 2019-12-10 ENCOUNTER — Encounter (INDEPENDENT_AMBULATORY_CARE_PROVIDER_SITE_OTHER): Payer: Self-pay | Admitting: Vascular Surgery

## 2019-12-10 DIAGNOSIS — I70219 Atherosclerosis of native arteries of extremities with intermittent claudication, unspecified extremity: Secondary | ICD-10-CM | POA: Insufficient documentation

## 2019-12-10 DIAGNOSIS — R0989 Other specified symptoms and signs involving the circulatory and respiratory systems: Secondary | ICD-10-CM | POA: Insufficient documentation

## 2019-12-10 NOTE — Progress Notes (Signed)
MRN : [id number]  Henry Mcmahon is a 79 y.o. (1940/07/31) male who presents with chief complaint of  Chief Complaint  Patient presents with  . New Patient (Initial Visit)    Claudication  .  History of Present Illness:    The patient is seen for evaluation of painful lower extremities and diminished pulses. Patient notes the pain is always associated with activity and is very consistent day today. Typically, the pain occurs at less than one block, progress is as activity continues to the point that the patient must stop walking. Resting including standing still for several minutes allowed resumption of the activity and the ability to walk a similar distance before stopping again. Uneven terrain and inclined shorten the distance. The pain has been progressive over the past several years. The patient states the inability to walk is now having a profound negative impact on quality of life and daily activities.  The patient denies rest pain or dangling of an extremity off the side of the bed during the night for relief. No open wounds or sores at this time. No prior interventions or surgeries.  No history of back problems or DJD of the lumbar sacral spine.   The patient denies changes in claudication symptoms or new rest pain symptoms.  No new ulcers or wounds of the foot.  The patient's blood pressure has been stable and relatively well controlled. The patient denies amaurosis fugax or recent TIA symptoms. There are no recent neurological changes noted. The patient denies history of DVT, PE or superficial thrombophlebitis. The patient denies recent episodes of angina or shortness of breath.   Current Meds  Medication Sig  . aspirin EC 81 MG tablet Take by mouth.  Marland Kitchen atorvastatin (LIPITOR) 40 MG tablet Take 40 mg by mouth at bedtime.  . clopidogrel (PLAVIX) 75 MG tablet Take 75 mg by mouth daily.  . furosemide (LASIX) 20 MG tablet Take by mouth.  . gabapentin (NEURONTIN) 300 MG capsule  Take by mouth.  . isosorbide mononitrate (IMDUR) 30 MG 24 hr tablet Take by mouth.  Marland Kitchen lisinopril (PRINIVIL,ZESTRIL) 5 MG tablet Take 5 mg by mouth daily.  . metoprolol succinate (TOPROL-XL) 25 MG 24 hr tablet Take 25 mg by mouth daily.  . nitroGLYCERIN (NITROSTAT) 0.4 MG SL tablet Place 0.4 mg under the tongue every 5 (five) minutes as needed for chest pain.    Past Medical History:  Diagnosis Date  . Cancer [facility])    prostate  . CHF (congestive heart failure) ([place])   . Collagen vascular disease ([place])   . COPD (chronic obstructive pulmonary disease) ([place])   . Coronary artery disease   . ETOH abuse   . Gastric ulcer   . H/O: UGI bleed   . History of blood transfusion   . HLD (hyperlipidemia)   . Hypertension   . Myocardial infarction ([place])   . VHD (valvular heart disease)     Past Surgical History:  Procedure Laterality Date  . COLON SURGERY    . COLONOSCOPY    . CORONARY ARTERY BYPASS GRAFT    . HEMORRHOID SURGERY    . INSERT / REPLACE / REMOVE PACEMAKER    . LUMBAR DISC SURGERY    . MICROLARYNGOSCOPY N/A 02/19/2016   Procedure: MICROLARYNGOSCOPY;  Surgeon: Clyde Canterbury, MD;  Location: [facility] ORS;  Service: ENT;  Laterality: N/A;  . UPPER GASTROINTESTINAL ENDOSCOPY      Social History Social History   Tobacco Use  . Smoking status:  Current Every Day Smoker    Packs/day: 0.25    Types: Cigarettes  . Smokeless tobacco: Never Used  Substance Use Topics  . Alcohol use: Yes    Alcohol/week: 36.0 standard drinks    Types: 36 Cans of beer per week  . Drug use: No    Family History No family history of bleeding/clotting disorders, porphyria or autoimmune disease   No Known Allergies   REVIEW OF SYSTEMS (Negative unless checked)  Constitutional: [] Weight loss  [] Fever  [] Chills Cardiac: [] Chest pain   [] Chest pressure   [] Palpitations   [] Shortness of breath when laying flat   [] Shortness of breath with exertion. Vascular:  [x] Pain in legs with walking   [] Pain in  legs at rest  [] History of DVT   [] Phlebitis   [x] Swelling in legs   [] Varicose veins   [] Non-healing ulcers Pulmonary:   [] Uses home oxygen   [] Productive cough   [] Hemoptysis   [] Wheeze  [x] COPD   [] Asthma Neurologic:  [] Dizziness   [] Seizures   [] History of stroke   [] History of TIA  [] Aphasia   [] Vissual changes   [] Weakness or numbness in arm   [] Weakness or numbness in leg Musculoskeletal:   [] Joint swelling   [x] Joint pain   [x] Low back pain Hematologic:  [] Easy bruising  [] Easy bleeding   [] Hypercoagulable state   [] Anemic Gastrointestinal:  [] Diarrhea   [] Vomiting  [] Gastroesophageal reflux/heartburn   [] Difficulty swallowing. Genitourinary:  [] Chronic kidney disease   [] Difficult urination  [] Frequent urination   [] Blood in urine Skin:  [] Rashes   [] Ulcers  Psychological:  [] History of anxiety   []  History of major depression.  Physical Examination  Vitals:   12/04/19 1037  BP: (!) 169/83  Pulse: 87  Weight: 129 lb (58.5 kg)  Height: 5\' 10"  (1.778 m)   Body mass index is 18.51 kg/m. Gen: WD/WN, NAD Head: [place]/AT, No temporalis wasting.  Ear/Nose/Throat: Hearing grossly intact, nares w/o erythema or drainage, poor dentition Eyes: PER, EOMI, sclera nonicteric.  Neck: Supple, no masses.  No bruit or JVD.  Pulmonary:  Good air movement, clear to auscultation bilaterally, no use of accessory muscles.  Cardiac: RRR, normal S1, S2, no Murmurs. Vascular: scattered varicosities present bilaterally.  Mild venous stasis changes to the legs bilaterally.  2+ soft pitting edema Vessel Right Left  Radial Palpable Palpable  Popliteal Increased Palpable Increased Palpable  PT Not Palpable Not Palpable  DP Not Palpable Not Palpable  Gastrointestinal: soft, non-distended. No guarding/no peritoneal signs.  Musculoskeletal: M/S 5/5 throughout.  No deformity or atrophy.  Neurologic: CN 2-12 intact. Pain and light touch intact in extremities.  Symmetrical.  Speech is fluent. Motor exam as listed  above. Psychiatric: Judgment intact, Mood & affect appropriate for pt's clinical situation. Dermatologic: mild rashes or ulcers noted.  No changes consistent with cellulitis.   CBC Lab Results  Component Value Date   WBC 6.3 09/08/2016   HGB 13.4 09/08/2016   HCT 40.6 09/08/2016   MCV 87.5 09/08/2016   PLT 256 09/08/2016    BMET    Component Value Date/Time   NA 139 02/06/2016 0828   NA 137 04/26/2012 2220   K 4.6 02/06/2016 0828   K 4.6 04/26/2012 2220   CL 107 02/06/2016 0828   CL 107 04/26/2012 2220   CO2 25 02/06/2016 0828   CO2 22 04/26/2012 2220   GLUCOSE 63 (L) 02/06/2016 0828   GLUCOSE 99 04/26/2012 2220   BUN 6 02/06/2016 0828   BUN 6 (L)  04/26/2012 2220   CREATININE 0.84 09/08/2016 1320   CREATININE 0.86 04/26/2012 2220   CALCIUM 9.1 02/06/2016 0828   CALCIUM 8.7 04/26/2012 2220   GFRNONAA >60 09/08/2016 1320   GFRNONAA >60 04/26/2012 2220   GFRAA >60 09/08/2016 1320   GFRAA >60 04/26/2012 2220   CrCl cannot be calculated (Patient's most recent lab result is older than the maximum 21 days allowed.).  COAG No results found for: INR, PROTIME  Radiology No results found.    Assessment/Plan 1. Atherosclerosis of native artery of both lower extremities with intermittent claudication (HCC) Recommend:  Patient should undergo ABI's and arterial duplex given the patient's lower extremity symptoms.  The patient states they are having increased pain and a marked decrease in the distance that they can walk.  The risks and benefits as well as the alternatives were discussed in detail with the patient.  All questions were answered.  Patient agrees to proceed and understands this could be a prelude to angiography and intervention.  The patient will follow up with me in the office to review the studies.   - VAS [place] LOWER EXTREMITY ARTERIAL DUPLEX; Future - VAS [place] ABI WITH/WO TBI; Future  2. Prominent popliteal pulse No surgery or intervention at this  time.  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  I have discussed the natural history of popliteal aneurysm and the small risk of thrombosis for aneurysm less than 2.5 cm in size.  However, as these small aneurysms tend to enlarge over time, continued surveillance with ultrasound is mandatory.   I have also discussed optimizing medical management with hypertension and lipid control and the importance of abstinence from tobacco.  The patient is also encouraged to exercise a minimum of 30 minutes 4 times a week.   Should the patient develop new leg pain or signs of peripheral embolization they are instructed to seek medical attention immediately and to alert the physician providing care that they have an aneurysm.   The patient voices their understanding.  - VAS [place] LOWER EXTREMITY ARTERIAL DUPLEX; Future  3. Chronic obstructive pulmonary disease, unspecified COPD type ([place]) Continue pulmonary medications and aerosols as already ordered, these medications have been reviewed and there are no changes at this time.    4. Coronary artery disease of native artery of native heart with stable angina pectoris (HCC) Continue cardiac and antihypertensive medications as already ordered and reviewed, no changes at this time.  Continue statin as ordered and reviewed, no changes at this time  Nitrates PRN for chest pain   5. Benign essential hypertension Continue antihypertensive medications as already ordered, these medications have been reviewed and there are no changes at this time.   6. DDD (degenerative disc disease), lumbar Continue NSAID medications as already ordered, these medications have been reviewed and there are no changes at this time.  Continued activity and therapy was stressed.     Hortencia Pilar, MD  12/10/2019 4:32 PM

## 2019-12-20 ENCOUNTER — Encounter (INDEPENDENT_AMBULATORY_CARE_PROVIDER_SITE_OTHER): Payer: Self-pay | Admitting: Nurse Practitioner

## 2019-12-20 ENCOUNTER — Ambulatory Visit (INDEPENDENT_AMBULATORY_CARE_PROVIDER_SITE_OTHER): Payer: Medicare Other

## 2019-12-20 ENCOUNTER — Ambulatory Visit (INDEPENDENT_AMBULATORY_CARE_PROVIDER_SITE_OTHER): Payer: Medicare Other | Admitting: Nurse Practitioner

## 2019-12-20 ENCOUNTER — Other Ambulatory Visit: Payer: Self-pay

## 2019-12-20 VITALS — BP 176/85 | HR 84 | Resp 16 | Wt 132.0 lb

## 2019-12-20 DIAGNOSIS — I70213 Atherosclerosis of native arteries of extremities with intermittent claudication, bilateral legs: Secondary | ICD-10-CM

## 2019-12-20 DIAGNOSIS — R0989 Other specified symptoms and signs involving the circulatory and respiratory systems: Secondary | ICD-10-CM

## 2019-12-21 ENCOUNTER — Encounter (INDEPENDENT_AMBULATORY_CARE_PROVIDER_SITE_OTHER): Payer: Self-pay | Admitting: Nurse Practitioner

## 2019-12-21 NOTE — Progress Notes (Signed)
Patient not seen in office due to leaving prior to seeing provider.  Patient no longer wish to wait to be seen.  Results given to sister.

## 2023-10-12 ENCOUNTER — Other Ambulatory Visit: Payer: Self-pay

## 2023-10-12 ENCOUNTER — Encounter: Payer: Self-pay | Admitting: Cardiology

## 2023-10-12 ENCOUNTER — Encounter
Admission: RE | Disposition: A | Discharge status: Home Health | Payer: Self-pay | Source: Home / Self Care | Attending: Family Medicine

## 2023-10-12 ENCOUNTER — Inpatient Hospital Stay
Admission: RE | Admit: 2023-10-12 | Discharge: 2023-10-12 | Disposition: A | Discharge status: Home / Self Care | Source: Home / Self Care | Attending: Internal Medicine | Admitting: Internal Medicine

## 2023-10-12 ENCOUNTER — Inpatient Hospital Stay

## 2023-10-12 ENCOUNTER — Inpatient Hospital Stay
Admission: RE | Admit: 2023-10-12 | Discharge: 2023-10-17 | DRG: 245 | Disposition: A | Discharge status: Home Health | Attending: Family Medicine | Admitting: Family Medicine

## 2023-10-12 DIAGNOSIS — L7632 Postprocedural hematoma of skin and subcutaneous tissue following other procedure: Secondary | ICD-10-CM | POA: Diagnosis present

## 2023-10-12 DIAGNOSIS — Z681 Body mass index (BMI) 19 or less, adult: Secondary | ICD-10-CM

## 2023-10-12 DIAGNOSIS — D72829 Elevated white blood cell count, unspecified: Secondary | ICD-10-CM | POA: Diagnosis present

## 2023-10-12 DIAGNOSIS — Y831 Surgical operation with implant of artificial internal device as the cause of abnormal reaction of the patient, or of later complication, without mention of misadventure at the time of the procedure: Secondary | ICD-10-CM | POA: Diagnosis present

## 2023-10-12 DIAGNOSIS — I110 Hypertensive heart disease with heart failure: Secondary | ICD-10-CM | POA: Diagnosis present

## 2023-10-12 DIAGNOSIS — T82837A Hemorrhage of cardiac prosthetic devices, implants and grafts, initial encounter: Principal | ICD-10-CM | POA: Diagnosis present

## 2023-10-12 DIAGNOSIS — I5022 Chronic systolic (congestive) heart failure: Secondary | ICD-10-CM | POA: Diagnosis present

## 2023-10-12 DIAGNOSIS — M25511 Pain in right shoulder: Secondary | ICD-10-CM | POA: Diagnosis present

## 2023-10-12 DIAGNOSIS — E861 Hypovolemia: Secondary | ICD-10-CM | POA: Diagnosis present

## 2023-10-12 DIAGNOSIS — Z79899 Other long term (current) drug therapy: Secondary | ICD-10-CM

## 2023-10-12 DIAGNOSIS — I2510 Atherosclerotic heart disease of native coronary artery without angina pectoris: Secondary | ICD-10-CM | POA: Diagnosis present

## 2023-10-12 DIAGNOSIS — S40022A Contusion of left upper arm, initial encounter: Secondary | ICD-10-CM | POA: Diagnosis present

## 2023-10-12 DIAGNOSIS — D62 Acute posthemorrhagic anemia: Secondary | ICD-10-CM | POA: Diagnosis present

## 2023-10-12 DIAGNOSIS — R64 Cachexia: Secondary | ICD-10-CM | POA: Diagnosis present

## 2023-10-12 DIAGNOSIS — J449 Chronic obstructive pulmonary disease, unspecified: Secondary | ICD-10-CM | POA: Diagnosis present

## 2023-10-12 DIAGNOSIS — R55 Syncope and collapse: Secondary | ICD-10-CM | POA: Diagnosis not present

## 2023-10-12 DIAGNOSIS — I252 Old myocardial infarction: Secondary | ICD-10-CM

## 2023-10-12 DIAGNOSIS — R4701 Aphasia: Secondary | ICD-10-CM | POA: Diagnosis present

## 2023-10-12 DIAGNOSIS — I429 Cardiomyopathy, unspecified: Secondary | ICD-10-CM | POA: Diagnosis present

## 2023-10-12 DIAGNOSIS — I340 Nonrheumatic mitral (valve) insufficiency: Secondary | ICD-10-CM | POA: Diagnosis present

## 2023-10-12 DIAGNOSIS — I959 Hypotension, unspecified: Secondary | ICD-10-CM | POA: Diagnosis not present

## 2023-10-12 DIAGNOSIS — R579 Shock, unspecified: Secondary | ICD-10-CM | POA: Diagnosis not present

## 2023-10-12 DIAGNOSIS — I9581 Postprocedural hypotension: Secondary | ICD-10-CM | POA: Diagnosis present

## 2023-10-12 DIAGNOSIS — R578 Other shock: Secondary | ICD-10-CM | POA: Diagnosis present

## 2023-10-12 DIAGNOSIS — Z8546 Personal history of malignant neoplasm of prostate: Secondary | ICD-10-CM

## 2023-10-12 DIAGNOSIS — Z951 Presence of aortocoronary bypass graft: Secondary | ICD-10-CM

## 2023-10-12 DIAGNOSIS — F17210 Nicotine dependence, cigarettes, uncomplicated: Secondary | ICD-10-CM | POA: Diagnosis present

## 2023-10-12 DIAGNOSIS — E43 Unspecified severe protein-calorie malnutrition: Secondary | ICD-10-CM | POA: Diagnosis present

## 2023-10-12 DIAGNOSIS — Z45010 Encounter for checking and testing of cardiac pacemaker pulse generator [battery]: Secondary | ICD-10-CM

## 2023-10-12 DIAGNOSIS — E871 Hypo-osmolality and hyponatremia: Secondary | ICD-10-CM | POA: Diagnosis present

## 2023-10-12 DIAGNOSIS — G928 Other toxic encephalopathy: Secondary | ICD-10-CM | POA: Diagnosis present

## 2023-10-12 DIAGNOSIS — Z7902 Long term (current) use of antithrombotics/antiplatelets: Secondary | ICD-10-CM | POA: Diagnosis not present

## 2023-10-12 DIAGNOSIS — G9341 Metabolic encephalopathy: Secondary | ICD-10-CM

## 2023-10-12 DIAGNOSIS — Z955 Presence of coronary angioplasty implant and graft: Secondary | ICD-10-CM

## 2023-10-12 DIAGNOSIS — E785 Hyperlipidemia, unspecified: Secondary | ICD-10-CM | POA: Diagnosis present

## 2023-10-12 DIAGNOSIS — S20212A Contusion of left front wall of thorax, initial encounter: Secondary | ICD-10-CM | POA: Diagnosis present

## 2023-10-12 DIAGNOSIS — Z8711 Personal history of peptic ulcer disease: Secondary | ICD-10-CM

## 2023-10-12 DIAGNOSIS — F1010 Alcohol abuse, uncomplicated: Secondary | ICD-10-CM | POA: Diagnosis present

## 2023-10-12 HISTORY — PX: ICD GENERATOR CHANGEOUT: EP1231

## 2023-10-12 LAB — CBC
HCT: 33.9 % — ABNORMAL LOW (ref 39.0–52.0)
Hemoglobin: 11.5 g/dL — ABNORMAL LOW (ref 13.0–17.0)
MCH: 31.1 pg (ref 26.0–34.0)
MCHC: 33.9 g/dL (ref 30.0–36.0)
MCV: 91.6 fL (ref 80.0–100.0)
Platelets: 173 K/uL (ref 150–400)
RBC: 3.7 MIL/uL — ABNORMAL LOW (ref 4.22–5.81)
RDW: 15 % (ref 11.5–15.5)
WBC: 16.1 K/uL — ABNORMAL HIGH (ref 4.0–10.5)
nRBC: 0 % (ref 0.0–0.2)

## 2023-10-12 LAB — LACTIC ACID, PLASMA
Lactic Acid, Venous: 2.3 mmol/L (ref 0.5–1.9)
Lactic Acid, Venous: 1.6 mmol/L (ref 0.5–1.9)
Lactic Acid, Venous: 1.1 mmol/L (ref 0.5–1.9)

## 2023-10-12 LAB — CBC WITH DIFFERENTIAL/PLATELET
Abs Immature Granulocytes: 0.03 10*3/uL (ref 0.00–0.07)
Basophils Absolute: 0.1 10*3/uL (ref 0.0–0.1)
Basophils Relative: 1 %
Eosinophils Absolute: 0.2 10*3/uL (ref 0.0–0.5)
Eosinophils Relative: 2 %
HCT: 34.5 % — ABNORMAL LOW (ref 39.0–52.0)
Hemoglobin: 11.8 g/dL — ABNORMAL LOW (ref 13.0–17.0)
Immature Granulocytes: 0 %
Lymphocytes Relative: 19 %
Lymphs Abs: 1.7 10*3/uL (ref 0.7–4.0)
MCH: 30.8 pg (ref 26.0–34.0)
MCHC: 34.2 g/dL (ref 30.0–36.0)
MCV: 90.1 fL (ref 80.0–100.0)
Monocytes Absolute: 0.9 10*3/uL (ref 0.1–1.0)
Monocytes Relative: 10 %
Neutro Abs: 6 10*3/uL (ref 1.7–7.7)
Neutrophils Relative %: 68 %
Platelets: 168 10*3/uL (ref 150–400)
RBC: 3.83 MIL/uL — ABNORMAL LOW (ref 4.22–5.81)
RDW: 14.8 % (ref 11.5–15.5)
WBC: 8.8 10*3/uL (ref 4.0–10.5)
nRBC: 0 % (ref 0.0–0.2)

## 2023-10-12 LAB — COMPREHENSIVE METABOLIC PANEL WITH GFR
ALT: 16 U/L (ref 0–44)
AST: 23 U/L (ref 15–41)
Albumin: 3.6 g/dL (ref 3.5–5.0)
Alkaline Phosphatase: 44 U/L (ref 38–126)
Anion gap: 11 (ref 5–15)
BUN: 12 mg/dL (ref 8–23)
CO2: 20 mmol/L — ABNORMAL LOW (ref 22–32)
Calcium: 8.7 mg/dL — ABNORMAL LOW (ref 8.9–10.3)
Chloride: 100 mmol/L (ref 98–111)
Creatinine, Ser: 0.49 mg/dL — ABNORMAL LOW (ref 0.61–1.24)
GFR, Estimated: >60 mL/min (ref >60)
Glucose, Bld: 116 mg/dL — ABNORMAL HIGH (ref 70–99)
Potassium: 3.7 mmol/L (ref 3.5–5.1)
Sodium: 131 mmol/L — ABNORMAL LOW (ref 135–145)
Total Bilirubin: 1.3 mg/dL — ABNORMAL HIGH (ref 0.0–1.2)
Total Protein: 6.1 g/dL — ABNORMAL LOW (ref 6.5–8.1)

## 2023-10-12 LAB — FERRITIN: Ferritin: 22 ng/mL — ABNORMAL LOW (ref 24–336)

## 2023-10-12 LAB — HEMOGLOBIN AND HEMATOCRIT, BLOOD
HCT: 34.7 % — ABNORMAL LOW (ref 39.0–52.0)
Hemoglobin: 11.8 g/dL — ABNORMAL LOW (ref 13.0–17.0)

## 2023-10-12 LAB — IRON AND TIBC
Iron: 60 ug/dL (ref 45–182)
Saturation Ratios: 18 % (ref 17.9–39.5)
TIBC: 333 ug/dL (ref 250–450)
UIBC: 273 ug/dL

## 2023-10-12 LAB — GLUCOSE, CAPILLARY: Glucose-Capillary: 120 mg/dL — ABNORMAL HIGH (ref 70–99)

## 2023-10-12 LAB — TROPONIN I (HIGH SENSITIVITY)
Troponin I (High Sensitivity): 9 ng/L (ref <18)
Troponin I (High Sensitivity): 12 ng/L (ref <18)

## 2023-10-12 LAB — MRSA NEXT GEN BY PCR, NASAL: MRSA by PCR Next Gen: NOT DETECTED

## 2023-10-12 LAB — PROTIME-INR
INR: 1.1 (ref 0.8–1.2)
Prothrombin Time: 14.8 s (ref 11.4–15.2)

## 2023-10-12 SURGERY — ICD GENERATOR CHANGEOUT
Anesthesia: Moderate Sedation

## 2023-10-12 MED ORDER — MORPHINE SULFATE (PF) 2 MG/ML IV SOLN
INTRAVENOUS | Status: AC
  Filled 2023-10-12: qty 1

## 2023-10-12 MED ORDER — DICLOFENAC SODIUM 1 % EX GEL
2.0000 g | Freq: Once | CUTANEOUS | Status: AC
  Administered 2023-10-12: 2 g via TOPICAL
  Filled 2023-10-12: qty 100

## 2023-10-12 MED ORDER — LIDOCAINE HCL 1 % IJ SOLN
INTRAMUSCULAR | Status: AC
Start: 2023-10-12 — End: 2023-10-12
  Filled 2023-10-12: qty 40

## 2023-10-12 MED ORDER — HEPARIN (PORCINE) IN NACL 1000-0.9 UT/500ML-% IV SOLN
INTRAVENOUS | Status: AC
  Filled 2023-10-12: qty 500

## 2023-10-12 MED ORDER — SODIUM CHLORIDE 0.9 % IV SOLN: 250.0000 mL | INTRAVENOUS | Status: AC

## 2023-10-12 MED ORDER — SODIUM CHLORIDE 0.9 % IV SOLN
INTRAVENOUS | Status: DC | PRN
  Administered 2023-10-12: 80 mg

## 2023-10-12 MED ORDER — HEPARIN (PORCINE) IN NACL 1000-0.9 UT/500ML-% IV SOLN
INTRAVENOUS | Status: AC
Start: 2023-10-12 — End: 2023-10-12
  Filled 2023-10-12: qty 1000

## 2023-10-12 MED ORDER — ATORVASTATIN CALCIUM 20 MG PO TABS
40.0000 mg | ORAL_TABLET | Freq: Every day | ORAL | Status: DC
  Administered 2023-10-12 – 2023-10-16 (×5): 40 mg via ORAL
  Filled 2023-10-12 (×5): qty 2

## 2023-10-12 MED ORDER — SODIUM CHLORIDE 0.9 % IV SOLN: INTRAVENOUS | Status: DC

## 2023-10-12 MED ORDER — LIDOCAINE HCL (PF) 1 % IJ SOLN
INTRAMUSCULAR | Status: DC | PRN
  Administered 2023-10-12: 20 mL

## 2023-10-12 MED ORDER — PANTOPRAZOLE SODIUM 20 MG PO TBEC
20.0000 mg | DELAYED_RELEASE_TABLET | Freq: Every day | ORAL | Status: DC
  Administered 2023-10-13 – 2023-10-17 (×5): 20 mg via ORAL
  Filled 2023-10-12 (×5): qty 1

## 2023-10-12 MED ORDER — FENTANYL CITRATE (PF) 100 MCG/2ML IJ SOLN
INTRAMUSCULAR | Status: AC
  Filled 2023-10-12: qty 2

## 2023-10-12 MED ORDER — SODIUM CHLORIDE 0.9 % IV BOLUS
500.0000 mL | Freq: Once | INTRAVENOUS | Status: AC
  Administered 2023-10-12: 500 mL via INTRAVENOUS

## 2023-10-12 MED ORDER — ACETAMINOPHEN 325 MG PO TABS
325.0000 mg | ORAL_TABLET | ORAL | Status: DC | PRN
  Administered 2023-10-12 – 2023-10-16 (×6): 650 mg via ORAL
  Filled 2023-10-12 (×5): qty 2

## 2023-10-12 MED ORDER — LIDOCAINE HCL 1 % IJ SOLN
INTRAMUSCULAR | Status: AC
Start: 2023-10-12 — End: 2023-10-12
  Filled 2023-10-12: qty 20

## 2023-10-12 MED ORDER — PIPERACILLIN-TAZOBACTAM 3.375 G IVPB
3.3750 g | Freq: Three times a day (TID) | INTRAVENOUS | Status: DC
  Administered 2023-10-12 – 2023-10-14 (×6): 3.375 g via INTRAVENOUS
  Filled 2023-10-12 (×5): qty 50

## 2023-10-12 MED ORDER — ACETAMINOPHEN 325 MG PO TABS
ORAL_TABLET | ORAL | Status: AC
  Filled 2023-10-12: qty 2

## 2023-10-12 MED ORDER — ONDANSETRON HCL 4 MG/2ML IJ SOLN
INTRAMUSCULAR | Status: AC
  Filled 2023-10-12: qty 2

## 2023-10-12 MED ORDER — HEPARIN (PORCINE) IN NACL 1000-0.9 UT/500ML-% IV SOLN
INTRAVENOUS | Status: DC | PRN
  Administered 2023-10-12: 500 mL

## 2023-10-12 MED ORDER — MIDAZOLAM HCL 2 MG/2ML IJ SOLN
INTRAMUSCULAR | Status: AC
  Filled 2023-10-12: qty 2

## 2023-10-12 MED ORDER — MORPHINE SULFATE (PF) 2 MG/ML IV SOLN
2.0000 mg | INTRAVENOUS | Status: DC | PRN
  Administered 2023-10-12: 2 mg via INTRAVENOUS
  Filled 2023-10-12: qty 1

## 2023-10-12 MED ORDER — NOREPINEPHRINE 4 MG/250ML-% IV SOLN
INTRAVENOUS | Status: AC
  Filled 2023-10-12: qty 250

## 2023-10-12 MED ORDER — NOREPINEPHRINE 4 MG/250ML-% IV SOLN
0.0000 ug/min | INTRAVENOUS | Status: DC
  Administered 2023-10-12: 2 ug/min via INTRAVENOUS
  Administered 2023-10-13: 5 ug/min via INTRAVENOUS
  Filled 2023-10-12: qty 250

## 2023-10-12 MED ORDER — ONDANSETRON HCL 4 MG/2ML IJ SOLN
4.0000 mg | Freq: Four times a day (QID) | INTRAMUSCULAR | Status: DC | PRN
  Administered 2023-10-12: 4 mg via INTRAVENOUS

## 2023-10-12 MED ORDER — CEFAZOLIN SODIUM-DEXTROSE 2-4 GM/100ML-% IV SOLN
INTRAVENOUS | Status: AC
  Filled 2023-10-12: qty 100

## 2023-10-12 MED ORDER — CEFAZOLIN SODIUM-DEXTROSE 2-4 GM/100ML-% IV SOLN
2.0000 g | INTRAVENOUS | Status: AC
  Administered 2023-10-12: 2 g via INTRAVENOUS

## 2023-10-12 MED ORDER — SODIUM CHLORIDE 0.9 % IV SOLN
80.0000 mg | INTRAVENOUS | Status: DC
  Filled 2023-10-12: qty 2

## 2023-10-12 MED ORDER — CEPHALEXIN 500 MG PO CAPS: 500.0000 mg | ORAL_CAPSULE | Freq: Two times a day (BID) | ORAL | 0 refills | Status: AC

## 2023-10-12 MED ORDER — SODIUM CHLORIDE 0.9 % IV BOLUS
250.0000 mL | Freq: Once | INTRAVENOUS | Status: AC
  Administered 2023-10-12: 250 mL via INTRAVENOUS

## 2023-10-12 MED ORDER — CHLORHEXIDINE GLUCONATE CLOTH 2 % EX PADS
6.0000 | MEDICATED_PAD | Freq: Every day | CUTANEOUS | Status: DC
  Administered 2023-10-12 – 2023-10-15 (×3): 6 via TOPICAL

## 2023-10-12 MED ORDER — NOREPINEPHRINE 4 MG/250ML-% IV SOLN: 0.0000 ug/min | INTRAVENOUS | Status: DC

## 2023-10-12 SURGICAL SUPPLY — 10 items
CABLE SURG 12 DISP A/V CHANNEL (MISCELLANEOUS) IMPLANT
DEVICE DSSCT PLSMBLD 3.0S LGHT (MISCELLANEOUS) IMPLANT
DRAPE INCISE 23X17 STRL (DRAPES) IMPLANT
ICD COBALT XT QUAD CRT DTPA2QQ (ICD Generator) IMPLANT
KIT SYRINGE INJ CVI SPIKEX1 (MISCELLANEOUS) IMPLANT
PAD ELECT DEFIB RADIOL ZOLL (MISCELLANEOUS) IMPLANT
POUCH AIGIS-R ANTIBACT ICD LRG (Mesh General) IMPLANT
SUT VIC AB 2-0 CT2 27 (SUTURE) IMPLANT
SUT VIC AB 4-0 PS2 18 (SUTURE) IMPLANT
TRAY PACEMAKER INSERTION (PACKS) ×1 IMPLANT

## 2023-10-12 NOTE — Progress Notes (Signed)
 Pt left side of chest is swollen and there's a spot of blood on the dressing that has grown in size. Walked in to check on patient and he had his left arm raised behind his head, that's when swelling was noticed. Notified MD and PA came to assess site. Also, placed ice.

## 2023-10-12 NOTE — Progress Notes (Signed)
 I was informed by Dellis, RN, that patient experienced sudden drop in BP, as low as 59/40, from SBPs of 90-110s after copmplaining that he felt like he could not breathe.  This was associated with decreased alertness.  He started him on Levophed  drip at 4 mcgs.  I went back to the bedside to reassess the patient.  He was alert and conversant with family Johnny, sister, and his niece) at the bedside.  Systolic BP was 103.  2D echo has been completed but no official report available.  EF is still unknown. Will continue IV Levophed  infusion for now given recurrent hypotension.  Case discussed with Dr. Isadora, intensivist, who has graciously accepted the patient in transfer.  He will take over the care of this patient.

## 2023-10-12 NOTE — H&P (Addendum)
 History and Physical:    Henry Mcmahon   FMW:[id number] DOB: 1941/03/22 DOA: 10/12/2023  Referring MD/provider: Dr. Marsa Dooms PCP: Zackery Marsa Hacker, MD   Patient coming from: Home  Chief Complaint: Excessive bleeding post ICD change out  History of Present Illness:   Henry Mcmahon is a 83 y.o. male with medical history significant for cardiomyopathy s/p biventricular ICD, hypertension, hyperlipidemia, CAD, COPD, alcohol use disorder, gastric ulcer, history of upper GI bleeding, prostate cancer, who presented to the Cath Lab for ICD change out.  Patient was on Plavix prior to this procedure.  After the ICD change out, patient was found to be bleeding around the ICD.  Bleeding was thought to be excessive and unusual so decision was made to keep the patient overnight.  The hospitalist team was consulted to admit the patient.  When I went to the Cath Lab to see the patient, patient was surrounded by at least 3 nurses with 1 nurse applying pressure on the ICD generator.  Patient suddenly became unresponsive and his eyes rolled backwards.  Telemetry showed a paced rhythm with heart rate in the 80s.  However, blood pressure dropped to 82/65 and then dropped to systolic BP was 72.  Oxygen saturation was normal on room air.  Normal saline bolus 500 mL was immediately given.  Levophed  infusion was also ordered but was not given because BP improved.  BP came up to 91/65 and subsequently up to a systolic of 121.  Later on, patient became more responsive and started complaining of pain at the ICD site.  However, he was confused and could not provide much information. Dr. Dooms, cardiologist, arrived on the scene.  He did not think that sudden change in mental status was related to the procedure.   ROS:   ROS unable to obtain because of confusion  Past Medical History:   Past Medical History:  Diagnosis Date   Cancer (HCC)    prostate   CHF (congestive heart failure) (HCC)     Collagen vascular disease (HCC)    COPD (chronic obstructive pulmonary disease) (HCC)    Coronary artery disease    ETOH abuse    Gastric ulcer    H/O: UGI bleed    History of blood transfusion    HLD (hyperlipidemia)    Hypertension    Myocardial infarction (HCC)    VHD (valvular heart disease)     Past Surgical History:   Past Surgical History:  Procedure Laterality Date   COLON SURGERY     COLONOSCOPY     CORONARY ARTERY BYPASS GRAFT     HEMORRHOID SURGERY     INSERT / REPLACE / REMOVE PACEMAKER     LUMBAR DISC SURGERY     MICROLARYNGOSCOPY N/A 02/19/2016   Procedure: MICROLARYNGOSCOPY;  Surgeon: Deward Dolly, MD;  Location: [facility] ORS;  Service: ENT;  Laterality: N/A;   UPPER GASTROINTESTINAL ENDOSCOPY      Social History:   Social History   Socioeconomic History   Marital status: Single    Spouse name: Not on file   Number of children: Not on file   Years of education: Not on file   Highest education level: Not on file  Occupational History   Not on file  Tobacco Use   Smoking status: Every Day    Current packs/day: 0.25    Types: Cigarettes   Smokeless tobacco: Never  Substance and Sexual Activity   Alcohol use: Yes    Alcohol/week: 36.0 standard  drinks of alcohol    Types: 36 Cans of beer per week   Drug use: No   Sexual activity: Not on file  Other Topics Concern   Not on file  Social History Narrative   Not on file   Social Drivers of Health   Financial Resource Strain: Low Risk  (03/31/2023)   Received from [facility]   Overall Financial Resource Strain (CARDIA)    Difficulty of Paying Living Expenses: Not hard at all  Food Insecurity: No Food Insecurity (03/31/2023)   Received from [facility]   Hunger Vital Sign    Within the past 12 months, you worried that your food would run out before you got the money to buy more.: Never true    Within the past 12 months, the food you bought just didn't last and you  didn't have money to get more.: Never true  Transportation Needs: No Transportation Needs (03/31/2023)   Received from [facility] - Transportation    In the past 12 months, has lack of transportation kept you from medical appointments or from getting medications?: No    Lack of Transportation (Non-Medical): No  Physical Activity: Not on file  Stress: Not on file  Social Connections: Not on file  Intimate Partner Violence: Not on file    Allergies   Patient has no known allergies.  Family history:   History reviewed. No pertinent family history.  Current Medications:   Prior to Admission medications   Medication Sig Start Date End Date Taking? Authorizing Provider  atorvastatin  (LIPITOR) 40 MG tablet Take 40 mg by mouth at bedtime. 08/09/14  Yes [provider]  cephALEXin  (KEFLEX ) 500 MG capsule Take 1 capsule (500 mg total) by mouth 2 (two) times daily. 10/12/23  Yes Paraschos, Alexander, MD  cholecalciferol (VITAMIN D3) 25 MCG (1000 UNIT) tablet Take 1,000 Units by mouth daily.   Yes [provider]  clopidogrel (PLAVIX) 75 MG tablet Take 75 mg by mouth daily. 01/17/16  Yes [provider]  furosemide (LASIX) 20 MG tablet Take 20 mg by mouth daily. 11/06/19  Yes [provider]  isosorbide mononitrate (IMDUR) 30 MG 24 hr tablet 30 mg daily. 05/03/19  Yes [provider]  lisinopril (ZESTRIL) 10 MG tablet Take 10 mg by mouth daily. 01/13/16  Yes [provider]  nitroGLYCERIN (NITROSTAT) 0.4 MG SL tablet Place 0.4 mg under the tongue every 5 (five) minutes as needed for chest pain.   Yes [provider]  pantoprazole  (PROTONIX ) 20 MG tablet Take 20 mg by mouth daily. 01/02/16  Yes [provider]  tamsulosin (FLOMAX) 0.4 MG CAPS capsule Take 0.4 mg by mouth daily.   Yes [provider]  vitamin B-12 (CYANOCOBALAMIN) 500 MCG tablet Take 500 mcg by mouth daily.   Yes [provider]    Physical Exam:   Vitals:   10/12/23 1300 10/12/23 1331 10/12/23 1400 10/12/23 1411  BP: 101/62 (!) 114/94 (!) 93/49 (!) 91/47  Pulse: 81 (!) 104 (!) 101 (!) 102  Resp: 19 15 13 17   Temp: 97.6 F (36.4 C) 97.8 F (36.6 C)    TempSrc: Oral Oral    SpO2: 95% 93% 94% 95%  Weight:  60.4 kg    Height:  5' 10 (1.778 m)       Physical Exam: Blood pressure (!) 91/47, pulse (!) 102, temperature 97.8 F (36.6 C), temperature source Oral, resp. rate 17,  height 5' 10 (1.778 m), weight 60.4 kg, SpO2 95%. Gen: Unresponsive, laying in bed Head: Normocephalic, atraumatic. Eyes: Abnormal left eye.  No pallor or icterus  Mouth: Dry mucous membranes Neck: Supple, no jugular venous distention. Chest: Lungs are clear to auscultation with good air movement. No rales, rhonchi or wheezes.  CV: Heart sounds are regular with an S1, S2. No murmurs, rubs or gallops.  Abdomen: Soft, nontender, nondistended with normal active bowel sounds. No palpable masses. Extremities: Extremities are without clubbing, or cyanosis. No edema.  Cold hands and feet.   Skin: Warm and dry.  Dressing on ICD generator site on left upper chest was soaked with blood.  Swelling noted at ICD site. Neuro: Patient became unresponsive for a few minutes and subsequently became alert.  He was moving all extremities spontaneously when he became more alert. Psych: Limited exam because of confusion   Data Review:    Labs: Basic Metabolic Panel: Recent Labs  Lab 10/12/23 1221  NA 131*  K 3.7  CL 100  CO2 20*  GLUCOSE 116*  BUN 12  CREATININE 0.49*  CALCIUM  8.7*   Liver Function Tests: Recent Labs  Lab 10/12/23 1221  AST 23  ALT 16  ALKPHOS 44  BILITOT 1.3*  PROT 6.1*  ALBUMIN 3.6   No results for input(s): LIPASE, AMYLASE in the last 168 hours. No results for input(s): AMMONIA in the last 168 hours. CBC: Recent Labs  Lab 10/12/23 1221  WBC 8.8  NEUTROABS 6.0  HGB 11.8*  HCT 34.5*   MCV 90.1  PLT 168   Cardiac Enzymes: No results for input(s): CKTOTAL, CKMB, CKMBINDEX, TROPONINI in the last 168 hours.  BNP (last 3 results) No results for input(s): PROBNP in the last 8760 hours. CBG: Recent Labs  Lab 10/12/23 1325  GLUCAP 120*    Urinalysis No results found for: COLORURINE, APPEARANCEUR, LABSPEC, PHURINE, GLUCOSEU, HGBUR, BILIRUBINUR, KETONESUR, PROTEINUR, UROBILINOGEN, NITRITE, LEUKOCYTESUR    Radiographic Studies: EP PPM/ICD IMPLANT Result Date: 10/12/2023 Successful BiV ICD generator change out (Medtronic Cobalt XT HF Quad)    EKG: No EKG available at the time of this dictation Telemetry showed paced rhythm   Assessment/Plan:   Principal Problem:   ICD (implantable cardioverter-defibrillator) pocket hematoma Active Problems:   Hypotension   Syncope    Body mass index is 19.11 kg/m.   Altered mental status/syncope s/p ICD generator change out: Probably vasovagal syncope.  Admit to stepdown unit for close monitoring.  Obtain EKG and 2D echo for further evaluation.  Stat troponins, CBC, CMP and INR were ordered.   Hypotension: Systolic BP dropped into the 70s.  He responded to normal saline 500 mL bolus.  Given history of cardiomyopathy, will be cautious with IV fluids. Hold Lasix, isosorbide mononitrate, lisinopril   S/p ICD generator change out on 10/13/2023 with pocket hematoma/bleeding complication: Monitor H&H and transfuse as needed.     Acute blood loss anemia: Hemoglobin is down to 11.8.  Hemoglobin was 13.7 on 09/15/2023.  Repeat H&H and transfuse as needed.   Cardiomyopathy: No recent echo on file.  Last 2D echo was normal on October 04, 2020 and EF was estimated at 50% (said to have improved).   Hyponatremia: It appears patient has chronic hyponatremia based on chart review.   CAD: Hold Plavix    ADDENDUM: Lactic acid came back at 2.1.  Suspect this is from hypotension rather than sepsis.   Given that BP is still low (91/47) and is tachycardic (104), will give  additional fluid bolus of 250 mL pending 2D echo evaluation. Dellis, RN updated.   CRITICAL CARE Performed by: AIDA CHO   Total critical care time: 50 minutes  Critical care time was exclusive of separately billable procedures and treating other patients.  Critical care was necessary to treat or prevent imminent or life-threatening deterioration.  Critical care was time spent personally by me on the following activities: development of treatment plan with patient and/or surrogate as well as nursing, discussions with consultants, evaluation of patient's response to treatment, examination of patient, obtaining history from patient or surrogate, ordering and performing treatments and interventions, ordering and review of laboratory studies, ordering and review of radiographic studies, pulse oximetry and re-evaluation of patient's condition.    Other information:   DVT prophylaxis: SCDs  Code Status: Full code. Family Communication: Plan of care was discussed with Richardson, patient's sister, over the phone Disposition Plan: Plan to discharge home Consults called: Cardiologist Admission status: Inpatient  The medical decision making on this patient was of high complexity and the patient is at high risk for clinical deterioration, therefore this is a level 3 visit.     [place] [facility] Pager: Please check [email or web address]   How to contact the [facility] Attending or Consulting provider 7A - 7P or covering provider during after hours 7P -7A, for this patient?   Check the care team in [facility] and look for a) attending/consulting [facility] provider listed and b) the [facility] team listed Log into [email or web address] and use [place]'s universal password to access. If you do not have the password, please contact the hospital operator. Locate the [facility] provider you are looking for under [facility] and page to a number  that you can be directly reached. If you still have difficulty reaching the provider, please page the [facility] (Director on Call) for the Hospitalists listed on amion for assistance.  10/12/2023, 2:37 PM

## 2023-10-12 NOTE — Consult Note (Signed)
 [facility] CLINIC CARDIOLOGY CONSULT NOTE       Patient ID: Henry Mcmahon MRN: [id number] DOB/AGE: 1941-01-27 83 y.o.  Admit date: 10/12/2023 Referring Physician Dr. Jens Primary Physician Borun, Marsa Hacker, MD Primary Cardiologist Dr. Dewane  Reason for Consultation Hematoma s/p ICD generator changeout  HPI: Henry Mcmahon is a 83 y.o. male  with a past medical history of CAD s/p 3v CABG in 2002 (LIMA to LAD, SVG to ramus, SVG to D1) and multiple stents, chronic HFmrEF s/p Biventricular ICD, hypertension, hyperlipidemia who presented for outpatient ICD generator change out on 10/12/2023 due to device at [facility] at pacer clinic.  Patient underwent ICD generator change out with Dr. Nolen s/p revealed significant hematoma, active bleeding and swelling. Of note patient was taking Plavix at home. In the specials recovery event, while holding pressure on incision site for about 10 minutes and patient suddenly became hypotensive, not responding to questions appropriately, not making eye contact.  Code stroke was called and patient started on IV fluids, BP improved and patient became more responsive and started to answer questions appropriately, code stroke canceled at this point.  Patient then transferred to stepdown.  Work up today notable for sodium 131, potassium 3.7, creatinine 0.49, bilirubin 1.3, Hgb 11.8, plts 168.  Troponins negative x2 and EKG ordered. Lactic elevated at 2.3.   At the time of my evaluation this afternoon, patient was resting comfortably in hospital bed.  Patient states he feels much better just feels a bit tired.  Left chest hematoma/swelling has improved.  Patient is alert and oriented and answering questions appropriately.  Patient denies any chest pain, palpitations, SOB, lightheadedness.  Patient states his incision site does not have any more pain. BP borderline,not requiring pressors. HR is stable. Per tele ventricular paced in 80s.    Review of systems complete  and found to be negative unless listed above    Past Medical History:  Diagnosis Date   Cancer [facility])    prostate   CHF (congestive heart failure) (HCC)    Collagen vascular disease (HCC)    COPD (chronic obstructive pulmonary disease) (HCC)    Coronary artery disease    ETOH abuse    Gastric ulcer    H/O: UGI bleed    History of blood transfusion    HLD (hyperlipidemia)    Hypertension    Myocardial infarction (HCC)    VHD (valvular heart disease)     Past Surgical History:  Procedure Laterality Date   COLON SURGERY     COLONOSCOPY     CORONARY ARTERY BYPASS GRAFT     HEMORRHOID SURGERY     INSERT / REPLACE / REMOVE PACEMAKER     LUMBAR DISC SURGERY     MICROLARYNGOSCOPY N/A 02/19/2016   Procedure: MICROLARYNGOSCOPY;  Surgeon: Deward Dolly, MD;  Location: [facility] ORS;  Service: ENT;  Laterality: N/A;   UPPER GASTROINTESTINAL ENDOSCOPY      Medications Prior to Admission  Medication Sig Dispense Refill Last Dose/Taking   atorvastatin  (LIPITOR) 40 MG tablet Take 40 mg by mouth at bedtime.   10/11/2023   cholecalciferol (VITAMIN D3) 25 MCG (1000 UNIT) tablet Take 1,000 Units by mouth daily.   10/12/2023 Morning   clopidogrel (PLAVIX) 75 MG tablet Take 75 mg by mouth daily.   10/12/2023 Morning   furosemide (LASIX) 20 MG tablet Take 20 mg by mouth daily.   10/12/2023 Morning   isosorbide mononitrate (IMDUR) 30 MG 24 hr tablet 30 mg daily.   10/12/2023  Morning   lisinopril (ZESTRIL) 10 MG tablet Take 10 mg by mouth daily.   10/12/2023 Morning   nitroGLYCERIN (NITROSTAT) 0.4 MG SL tablet Place 0.4 mg under the tongue every 5 (five) minutes as needed for chest pain.   Taking As Needed   pantoprazole  (PROTONIX ) 20 MG tablet Take 20 mg by mouth daily.   10/12/2023 Morning   tamsulosin (FLOMAX) 0.4 MG CAPS capsule Take 0.4 mg by mouth daily.   10/12/2023 Morning   vitamin B-12 (CYANOCOBALAMIN) 500 MCG tablet Take 500 mcg by mouth daily.   10/12/2023 Morning   Social History   Socioeconomic  History   Marital status: Single    Spouse name: Not on file   Number of children: Not on file   Years of education: Not on file   Highest education level: Not on file  Occupational History   Not on file  Tobacco Use   Smoking status: Every Day    Current packs/day: 0.25    Types: Cigarettes   Smokeless tobacco: Never  Substance and Sexual Activity   Alcohol use: Yes    Alcohol/week: 36.0 standard drinks of alcohol    Types: 36 Cans of beer per week   Drug use: No   Sexual activity: Not on file  Other Topics Concern   Not on file  Social History Narrative   Not on file   Social Drivers of Health   Financial Resource Strain: Low Risk  (03/31/2023)   Received from [facility]   Overall Financial Resource Strain (CARDIA)    Difficulty of Paying Living Expenses: Not hard at all  Food Insecurity: No Food Insecurity (03/31/2023)   Received from [facility]   Hunger Vital Sign    Within the past 12 months, you worried that your food would run out before you got the money to buy more.: Never true    Within the past 12 months, the food you bought just didn't last and you didn't have money to get more.: Never true  Transportation Needs: No Transportation Needs (03/31/2023)   Received from [facility] - Transportation    In the past 12 months, has lack of transportation kept you from medical appointments or from getting medications?: No    Lack of Transportation (Non-Medical): No  Physical Activity: Not on file  Stress: Not on file  Social Connections: Not on file  Intimate Partner Violence: Not on file    History reviewed. No pertinent family history.   Vitals:   10/12/23 1500 10/12/23 1515 10/12/23 1530 10/12/23 1545  BP: (!) 96/52 91/60 (!) 95/53 (!) 93/57  Pulse: 94 91 91 97  Resp: 14 17 15 15   Temp:      TempSrc:      SpO2: 99% 96% 98% 99%  Weight:      Height:        PHYSICAL EXAM General: Chronically ill  appearing elderly male, well nourished, in no acute distress. HEENT: Normocephalic and atraumatic. Neck: No JVD.   Lungs: Normal respiratory effort on room air. Clear bilaterally to auscultation. No wheezes, crackles, rhonchi.  Heart: HRRR. Normal S1 and S2 without gallops or murmurs.  Abdomen: Non-distended appearing.  Msk: Normal strength and tone for age. Extremities: Warm and well perfused. No clubbing, cyanosis, edema.  Neuro: Alert and oriented X 3. Psych: Answers questions appropriately.   Labs: Basic Metabolic Panel: Recent Labs    10/12/23 1221  NA 131*  K  3.7  CL 100  CO2 20*  GLUCOSE 116*  BUN 12  CREATININE 0.49*  CALCIUM  8.7*   Liver Function Tests: Recent Labs    10/12/23 1221  AST 23  ALT 16  ALKPHOS 44  BILITOT 1.3*  PROT 6.1*  ALBUMIN 3.6   No results for input(s): LIPASE, AMYLASE in the last 72 hours. CBC: Recent Labs    10/12/23 1221  WBC 8.8  NEUTROABS 6.0  HGB 11.8*  HCT 34.5*  MCV 90.1  PLT 168   Cardiac Enzymes: Recent Labs    10/12/23 1221 10/12/23 1410  TROPONINIHS 9 12   BNP: No results for input(s): BNP in the last 72 hours. D-Dimer: No results for input(s): DDIMER in the last 72 hours. Hemoglobin A1C: No results for input(s): HGBA1C in the last 72 hours. Fasting Lipid Panel: No results for input(s): CHOL, HDL, LDLCALC, TRIG, CHOLHDL, LDLDIRECT in the last 72 hours. Thyroid Function Tests: No results for input(s): TSH, T4TOTAL, T3FREE, THYROIDAB in the last 72 hours.  Invalid input(s): FREET3 Anemia Panel: No results for input(s): VITAMINB12, FOLATE, FERRITIN, TIBC, IRON, RETICCTPCT in the last 72 hours.   Radiology: EP PPM/ICD IMPLANT Result Date: 10/12/2023 Successful BiV ICD generator change out (Medtronic Cobalt XT HF Quad)    ECHO ordered  TELEMETRY reviewed by me 10/12/2023: ventricular paced, rate 80s  EKG reviewed by me: ordered  Data reviewed by me 10/12/2023:  last 24h vitals tele labs imaging I/O ED provider note, admission H&P.  Principal Problem:   ICD (implantable cardioverter-defibrillator) pocket hematoma Active Problems:   Hypotension   Syncope    ASSESSMENT AND PLAN:  Henry Mcmahon is a 83 y.o. male  with a past medical history of CAD s/p 3v CABG in 2002 (LIMA to LAD, SVG to ramus, SVG to D1) and multiple stents, chronic HFmrEF s/p Biventricular ICD, hypertension, hyperlipidemia who presented for outpatient ICD generator change out on 10/12/2023 due to device at [facility] at pacer clinic.  Patient underwent ICD generator change out with Dr. Nolen s/p revealed significant hematoma, active bleeding and swelling. Of note patient was taking Plavix at home. In the specials recovery event, while holding pressure on incision site for about 10 minutes and patient suddenly became hypotensive, not responding to questions appropriately, not making eye contact.  Code stroke was called and patient started on IV fluids, BP improved and patient became more responsive and started to answer questions appropriately, code stroke canceled at this point.  Patient then transferred to stepdown.  # CAD s/p CABG x 3 and multiple stents (last [facility] 2016) # s/p Biventricular ICD  # AMS s/p generator changeout (07/22) # Chronic HFmrEF Patients presents for outpatient ICD generator changeout, at incision site had complications with hematoma and swelling. Trops negative x2.  Lactic elevated at 2.3. Patient appears euvolemic. BP borderline without pressors, improved s/p IVF. -Echo ordered. EKG ordered. -Monitor H&H. -Will continue to monitor left chest hematoma. -Hold Plavix. Do not resume at discharge, will be discussed at outpatient cardiology follow-up. -Continue atorvastatin  40 mg daily.  -Hold home Imdur, lisinopril, lasix as BP is borderline.  This patient's plan of care was discussed and created with Dr. Ammon and he is in agreement.  Signed: Dorene Comfort, PA-C  10/12/2023, 4:29 PM [facility] Cardiology

## 2023-10-12 NOTE — Progress Notes (Signed)
   10/12/23 1210  Spiritual Encounters  Type of Visit Initial  Care provided to: Pt not available (Family left; Nurse will page Chaplain when Family returns)  Conversation partners present during encounter Other (comment) (Code Stroke team)  Referral source Code page  Reason for visit Code  OnCall Visit Yes  Spiritual Framework  Presenting Themes Goals in life/care  Interventions  Spiritual Care Interventions Made Compassionate presence;Prayer;Other (comment) (Chaplain prayed silently for Pt.)

## 2023-10-12 NOTE — Discharge Instructions (Signed)
 Patient may shower 10/14/2023 at which time outer dressing may be removed, leave Steri-Strips on.

## 2023-10-12 NOTE — Progress Notes (Signed)
 Steri-strips saturated in blood. Cath lab here to change dressing again.   Before dressing change, pressure was held on pacemaker site D/T growing hematoma. Pt was in severe of pain followed by N/V, a drop in BP, and decreased LOC. MD in the room as well, rapid response called. Gave normal saline bolus. Patient became more aware & BP increased to WNL.

## 2023-10-12 NOTE — Progress Notes (Signed)
 Sister, Alisa, updated on pt status and location.

## 2023-10-12 NOTE — Significant Event (Signed)
 Responded to overhead page of Code Stroke. On arrival, MD present in room, specials staff present, Lauraine Ada RN ICU RRT present. Pt with spontaneous respirations, pulse. Reported pt is hard of hearing. Does not seem responsive to following commands. Dr. Ammon present in room. Pt on cardiac monitor, SPO2. Reported pt has a pacemaker and has been a paced rhythm.

## 2023-10-12 NOTE — Consult Note (Signed)
 NAME:  Henry Mcmahon, MRN:  [id number], DOB:  1940-09-25, LOS: 0 ADMISSION DATE:  10/12/2023, CONSULTATION DATE:  10/12/2023 REFERRING MD:  Dr. Jens, CHIEF COMPLAINT:  Hypotension   Brief Pt Description / Synopsis:  83 y.o. male who presented for elective ICD Generator change out.  Post procedure complicated by hematoma/bleeding at the pocket site, hypotension, and altered mental status requiring peripheral Levophed .  History of Present Illness:  Henry Mcmahon is a 83 y.o. male with medical history significant for cardiomyopathy s/p biventricular ICD, hypertension, hyperlipidemia, CAD, COPD, alcohol use disorder, gastric ulcer, history of upper GI bleeding, prostate cancer, who presented to the Cath Lab for ICD change out.  Patient was on Plavix prior to this procedure.  After the ICD change out, patient was found to be bleeding around the ICD.  Bleeding was thought to be excessive and unusual so decision was made to keep the patient overnight.  The hospitalist team was consulted to admit the patient.  When [facility] assess the patient in specials, he was surrounded by at least 3 nurses with 1 nurse applying pressure on the ICD generator.  Patient suddenly became unresponsive and his eyes rolled backwards.  Telemetry showed a paced rhythm with heart rate in the 80s.  However, blood pressure dropped to 82/65 and then dropped to systolic BP was 72.  Oxygen saturation was normal on room air.   Normal saline bolus 500 mL was immediately given.  Levophed  infusion was also ordered but was not given because BP improved.  BP came up to 91/65 and subsequently up to a systolic of 121.  Later on, patient became more responsive and started complaining of pain at the ICD site.  However, he was confused and could not provide much information. Dr. Ammon, cardiologist, arrived on the scene.  He did not think that sudden change in mental status was related to the procedure.  Code Stroke was called but cancelled at it was  felt his mentation was due to hypotension.  TRH asked to admit for further workup and treatment.  Please see Significant Hospital Events section below for full detailed hospital course.   Pertinent  Medical History   Past Medical History:  Diagnosis Date   Cancer [facility])    prostate   CHF (congestive heart failure) (HCC)    Collagen vascular disease (HCC)    COPD (chronic obstructive pulmonary disease) (HCC)    Coronary artery disease    ETOH abuse    Gastric ulcer    H/O: UGI bleed    History of blood transfusion    HLD (hyperlipidemia)    Hypertension    Myocardial infarction (HCC)    VHD (valvular heart disease)     Micro Data:  7/22: MRSA PCR>>negative  Antimicrobials:   Anti-infectives (From admission, onward)    Start     Dose/Rate Route Frequency Ordered Stop   10/12/23 0833  gentamicin  (GARAMYCIN ) 80 mg in sodium chloride  0.9 % 500 mL irrigation  Status:  Discontinued          As needed 10/12/23 0833 10/12/23 1320   10/12/23 0645  ceFAZolin  (ANCEF ) IVPB 2g/100 mL premix        2 g 200 mL/hr over 30 Minutes Intravenous On call 10/12/23 0632 10/12/23 0818   10/12/23 0015  gentamicin  (GARAMYCIN ) 80 mg in sodium chloride  0.9 % 500 mL irrigation  Status:  Discontinued        80 mg Irrigation On call 10/12/23 0004 10/12/23 1320  10/12/23 0000  cephALEXin  (KEFLEX ) 500 MG capsule        500 mg Oral 2 times daily 10/12/23 0907         Significant Hospital Events: Including procedures, antibiotic start and stop dates in addition to other pertinent events   7/22: Presented for elective ICD Generator change-out.  Post-procedure developed hematoma/bleeding at the site.  Became unresponsive, aphasic, and hypotensive with SBP 70-80's, code stroke was called but cancelled as mental status changes felt to be associated with hypotension,  Admitted by [facility], PCCM asked to consult due to vasopressors.  Interim History / Subjective:  As outlined above under Significant  Hospital Events section  Objective   Blood pressure (!) 103/50, pulse 73, temperature 97.8 F (36.6 C), temperature source Oral, resp. rate 14, height 5' 10 (1.778 m), weight 60.4 kg, SpO2 100%.        Intake/Output Summary (Last 24 hours) at 10/12/2023 1802 Last data filed at 10/12/2023 1131 Gross per 24 hour  Intake 455 ml  Output --  Net 455 ml   Filed Weights   10/12/23 0651 10/12/23 1331  Weight: 62.6 kg 60.4 kg    Examination: General: Acutely ill-appearing male, laying in bed, on room air, no acute distress HENT: Atraumatic, normocephalic, neck supple, no JVD Lungs: Clear breath sounds throughout, even, nonlabored, normal effort Cardiovascular: Paced rhythm, no murmurs, rubs, gallops Abdomen: Soft, nontender, nondistended, no guarding or otitis, bowel sounds positive x 4 Extremities: Left arm and shoulder following pacemaker change out, no edema Neuro: Awake and alert, oriented x 3, follows commands, no focal deficits, pupils PERRLA, speech clear GU: Deferred  Resolved Hospital Problem list     Assessment & Plan:   #Shock: suspect Hemorrhagic due to left chest pocket hematoma/bleeding following ICD Generator Change-out on 7/22 PMHx: Cardiomyopathy -Continuous cardiac monitoring -Maintain MAP >65 -Gentle IV fluids -Transfusions as indicated -Vasopressors as needed to maintain MAP goal -Trend lactic acid until normalized -HS Troponin negative x2 -Echocardiogram performed, results pending -Will obtain CT Chest to rule out hemothorax   #Acute Blood Loss Anemia  -Monitor for S/Sx of bleeding -Trend CBC -SCD's for VTE Prophylaxis  -Transfuse for Hgb <7  #Acute Metabolic Encephalopathy, suspect due to hypotension  #Questionable Syncope -Treatment of metabolic derangements as outlined above -Provide supportive care -Promote normal sleep/wake cycle and family presence -Avoid sedating medications as able     Best Practice (right click and Reselect all  SmartList Selections daily)   Diet/type: Regular consistency (see orders) DVT prophylaxis: SCD GI prophylaxis: N/A Lines: N/A Foley:  N/A Code Status:  full code Last date of multidisciplinary goals of care discussion [N/A]  7/22: Pt and family updated at bedside on plan of care.  Labs   CBC: Recent Labs  Lab 10/12/23 1221 10/12/23 1410  WBC 8.8  --   NEUTROABS 6.0  --   HGB 11.8* 11.8*  HCT 34.5* 34.7*  MCV 90.1  --   PLT 168  --     Basic Metabolic Panel: Recent Labs  Lab 10/12/23 1221  NA 131*  K 3.7  CL 100  CO2 20*  GLUCOSE 116*  BUN 12  CREATININE 0.49*  CALCIUM  8.7*   GFR: Estimated Creatinine Clearance: 61.9 mL/min (A) (by C-G formula based on SCr of 0.49 mg/dL (L)). Recent Labs  Lab 10/12/23 1221 10/12/23 1340  WBC 8.8  --   LATICACIDVEN  --  2.3*    Liver Function Tests: Recent Labs  Lab 10/12/23 1221  AST 23  ALT 16  ALKPHOS 44  BILITOT 1.3*  PROT 6.1*  ALBUMIN 3.6   No results for input(s): LIPASE, AMYLASE in the last 168 hours. No results for input(s): AMMONIA in the last 168 hours.  ABG No results found for: PHART, PCO2ART, PO2ART, HCO3, TCO2, ACIDBASEDEF, O2SAT   Coagulation Profile: Recent Labs  Lab 10/12/23 1221  INR 1.1    Cardiac Enzymes: No results for input(s): CKTOTAL, CKMB, CKMBINDEX, TROPONINI in the last 168 hours.  HbA1C: Hemoglobin A1C  Date/Time Value Ref Range Status  07/21/2011 04:22 AM 5.1 4.2 - 6.3 % Final    Comment:    The American Diabetes Association recommends that a primary goal of therapy should be <7% and that physicians should reevaluate the treatment regimen in patients with HbA1c values consistently >8%.     CBG: Recent Labs  Lab 10/12/23 1325  GLUCAP 120*    Review of Systems:   Positives in BOLD: Gen: Denies fever, chills, weight change, fatigue, night sweats HEENT: Denies blurred vision, double vision, hearing loss, tinnitus, sinus congestion,  rhinorrhea, sore throat, neck stiffness, dysphagia PULM: Denies shortness of breath, cough, sputum production, hemoptysis, wheezing CV: Denies chest pain with palpation of chest, edema, orthopnea, paroxysmal nocturnal dyspnea, palpitations GI: Denies abdominal pain, nausea, vomiting, diarrhea, hematochezia, melena, constipation, change in bowel habits GU: Denies dysuria, hematuria, polyuria, oliguria, urethral discharge Endocrine: Denies hot or cold intolerance, polyuria, polyphagia or appetite change Derm: Denies rash, dry skin, scaling or peeling skin change Heme: Denies easy bruising, bleeding, bleeding gums Neuro: Denies headache, numbness, weakness, slurred speech, loss of memory or consciousness   Past Medical History:  He,  has a past medical history of Cancer (HCC), CHF (congestive heart failure) (HCC), Collagen vascular disease (HCC), COPD (chronic obstructive pulmonary disease) (HCC), Coronary artery disease, ETOH abuse, Gastric ulcer, H/O: UGI bleed, History of blood transfusion, HLD (hyperlipidemia), Hypertension, Myocardial infarction (HCC), and VHD (valvular heart disease).   Surgical History:   Past Surgical History:  Procedure Laterality Date   COLON SURGERY     COLONOSCOPY     CORONARY ARTERY BYPASS GRAFT     HEMORRHOID SURGERY     INSERT / REPLACE / REMOVE PACEMAKER     LUMBAR DISC SURGERY     MICROLARYNGOSCOPY N/A 02/19/2016   Procedure: MICROLARYNGOSCOPY;  Surgeon: Deward Dolly, MD;  Location: [facility] ORS;  Service: ENT;  Laterality: N/A;   UPPER GASTROINTESTINAL ENDOSCOPY       Social History:   reports that he has been smoking cigarettes. He has never used smokeless tobacco. He reports current alcohol use of about 36.0 standard drinks of alcohol per week. He reports that he does not use drugs.   Family History:  His family history is not on file.   Allergies No Known Allergies   Home Medications  Prior to Admission medications   Medication Sig Start Date End  Date Taking? Authorizing Provider  atorvastatin  (LIPITOR) 40 MG tablet Take 40 mg by mouth at bedtime. 08/09/14  Yes [provider]  cephALEXin  (KEFLEX ) 500 MG capsule Take 1 capsule (500 mg total) by mouth 2 (two) times daily. 10/12/23  Yes Paraschos, Alexander, MD  cholecalciferol (VITAMIN D3) 25 MCG (1000 UNIT) tablet Take 1,000 Units by mouth daily.   Yes [provider]  clopidogrel (PLAVIX) 75 MG tablet Take 75 mg by mouth daily. 01/17/16  Yes [provider]  furosemide (LASIX) 20 MG tablet Take 20 mg by mouth daily. 11/06/19  Yes [provider]  isosorbide mononitrate (IMDUR) 30 MG 24 hr tablet 30 mg daily. 05/03/19  Yes [provider]  lisinopril (ZESTRIL) 10 MG tablet Take 10 mg by mouth daily. 01/13/16  Yes [provider]  nitroGLYCERIN (NITROSTAT) 0.4 MG SL tablet Place 0.4 mg under the tongue every 5 (five) minutes as needed for chest pain.   Yes [provider]  pantoprazole  (PROTONIX ) 20 MG tablet Take 20 mg by mouth daily. 01/02/16  Yes [provider]  tamsulosin (FLOMAX) 0.4 MG CAPS capsule Take 0.4 mg by mouth daily.   Yes [provider]  vitamin B-12 (CYANOCOBALAMIN) 500 MCG tablet Take 500 mcg by mouth daily.   Yes [provider]     Critical care time: 50 minutes     Inge Lecher, AGACNP-BC [facility] Prefer epic messenger for cross cover needs If after hours, please call E-link

## 2023-10-12 NOTE — Code Documentation (Addendum)
 Stroke Response Nurse Documentation Code Documentation  Henry Mcmahon is a 83 y.o. male with past medical hx of CAD, HLD, HTN, MI, CHF, ETOH use, collagen vascular disorder, gastric ulcer, . On clopidogrel 75 mg daily. Code stroke was activated by specials recovery.  Per bedside staff, patient was at [facility] here today for  pacemaker placement. Patient at baseline prior to procedure talking and oriented. After procedure, pt was showing signs of bleeding/swelling at procedure site and then patient became less responsive and aphasic. LKW 1155. Patient with MAP in low 60s on arrival. Code stroke cancelled after discussion with neurologist MD Bhagat and Dr. Jens.  Care Plan: code stroke cancelled     Burnard KANDICE Bras  Stroke Response RN

## 2023-10-12 NOTE — Significant Event (Signed)
 Rapid Response Event Note   Reason for Call :  Code stroke  Initial Focused Assessment:  Rapid response RN arrived in patient's bay with patient surrounded by Dr. Jens, cath lab RNs, and specials recovery RNs. Patient lying on stretcher staring off into space with periods of unresponsiveness. Cath lab RNs holding pressure at insertion site on chest. Fluid bolus started prior to rapid response RN arrival by Dr. Jens and Dr. Jens ordered levophed  infusion. Vital signs with BP 77/53 MAP 61, HR 63 paced, RR low 20s, oxygen saturations 93% on room air. Stroke team arrived at bedside and canceled code stroke due to hemodynamic instability. As bolus infused and second IV started for levophed , BP came back at 91/65 MAP 76 so levophed  held. Patient became more alert, localizing pain, and tracking staff though not saying anything verbally.  Interventions:  Specials procedures RN started a second IV. Fluid bolus continued per orders from Dr. Jens. Levophed  ordered but not started due to improving BP. Just before rapid response RN left bedside patient's BP was 119/83 MAP 90, HR 76 paced, RR 21 but unlabored, oxygen saturations 98% on room air. Staff at bedside to collect lab work per MD orders.  Plan of Care:  MDs at bedside evaluating plan of care. No further needs from rapid response team at this time.  Event Summary:   MD Notified: Dr. Jens and Dr. Ammon Call Time: 12:09 Arrival Time: 12:11 End Time: 12:21  GEORGINA LAURAINE NORRIS, RN

## 2023-10-13 ENCOUNTER — Encounter: Payer: Self-pay | Admitting: Cardiology

## 2023-10-13 DIAGNOSIS — E43 Unspecified severe protein-calorie malnutrition: Secondary | ICD-10-CM | POA: Insufficient documentation

## 2023-10-13 DIAGNOSIS — T82837A Hemorrhage of cardiac prosthetic devices, implants and grafts, initial encounter: Secondary | ICD-10-CM | POA: Diagnosis not present

## 2023-10-13 DIAGNOSIS — R579 Shock, unspecified: Secondary | ICD-10-CM | POA: Diagnosis not present

## 2023-10-13 DIAGNOSIS — G9341 Metabolic encephalopathy: Secondary | ICD-10-CM | POA: Diagnosis not present

## 2023-10-13 LAB — CBC
HCT: 31.2 % — ABNORMAL LOW (ref 39.0–52.0)
Hemoglobin: 10.6 g/dL — ABNORMAL LOW (ref 13.0–17.0)
MCH: 29.9 pg (ref 26.0–34.0)
MCHC: 34 g/dL (ref 30.0–36.0)
MCV: 88.1 fL (ref 80.0–100.0)
Platelets: 163 K/uL (ref 150–400)
RBC: 3.54 MIL/uL — ABNORMAL LOW (ref 4.22–5.81)
RDW: 14.8 % (ref 11.5–15.5)
WBC: 8.1 K/uL (ref 4.0–10.5)
nRBC: 0 % (ref 0.0–0.2)

## 2023-10-13 LAB — ECHOCARDIOGRAM COMPLETE
Height: 69 in
S' Lateral: 2.2 cm
Weight: 2208 [oz_av]

## 2023-10-13 LAB — BASIC METABOLIC PANEL WITH GFR
Anion gap: 9 (ref 5–15)
BUN: 15 mg/dL (ref 8–23)
CO2: 22 mmol/L (ref 22–32)
Calcium: 8.8 mg/dL — ABNORMAL LOW (ref 8.9–10.3)
Chloride: 102 mmol/L (ref 98–111)
Creatinine, Ser: 0.71 mg/dL (ref 0.61–1.24)
GFR, Estimated: >60 mL/min (ref >60)
Glucose, Bld: 128 mg/dL — ABNORMAL HIGH (ref 70–99)
Potassium: 4.3 mmol/L (ref 3.5–5.1)
Sodium: 133 mmol/L — ABNORMAL LOW (ref 135–145)

## 2023-10-13 MED ORDER — LACTATED RINGERS IV BOLUS
1000.0000 mL | Freq: Once | INTRAVENOUS | Status: AC
  Administered 2023-10-13: 1000 mL via INTRAVENOUS

## 2023-10-13 MED ORDER — FOLIC ACID 1 MG PO TABS
1.0000 mg | ORAL_TABLET | Freq: Every day | ORAL | Status: DC
  Administered 2023-10-14 – 2023-10-17 (×4): 1 mg via ORAL
  Filled 2023-10-13 (×4): qty 1

## 2023-10-13 MED ORDER — ADULT MULTIVITAMIN W/MINERALS CH
1.0000 | ORAL_TABLET | Freq: Every day | ORAL | Status: DC
  Administered 2023-10-14 – 2023-10-17 (×4): 1 via ORAL
  Filled 2023-10-13 (×4): qty 1

## 2023-10-13 MED ORDER — ENSURE PLUS HIGH PROTEIN PO LIQD
237.0000 mL | Freq: Three times a day (TID) | ORAL | Status: DC
  Administered 2023-10-15 – 2023-10-16 (×4): 237 mL via ORAL

## 2023-10-13 MED ORDER — MIDODRINE HCL 5 MG PO TABS
10.0000 mg | ORAL_TABLET | Freq: Three times a day (TID) | ORAL | Status: DC
  Administered 2023-10-13 – 2023-10-15 (×6): 10 mg via ORAL
  Filled 2023-10-13 (×6): qty 2

## 2023-10-13 MED ORDER — TRAMADOL HCL 50 MG PO TABS
50.0000 mg | ORAL_TABLET | Freq: Four times a day (QID) | ORAL | Status: DC | PRN
  Administered 2023-10-13 – 2023-10-17 (×4): 50 mg via ORAL
  Filled 2023-10-13 (×4): qty 1

## 2023-10-13 MED ORDER — LACTATED RINGERS IV SOLN: INTRAVENOUS | Status: AC

## 2023-10-13 MED ORDER — THIAMINE HCL 100 MG PO TABS
100.0000 mg | ORAL_TABLET | Freq: Every day | ORAL | Status: DC
  Administered 2023-10-14 – 2023-10-17 (×4): 100 mg via ORAL
  Filled 2023-10-13 (×8): qty 1

## 2023-10-13 NOTE — Progress Notes (Signed)
 NAME:  Henry Mcmahon, MRN:  [id number], DOB:  01-11-1941, LOS: 1 ADMISSION DATE:  10/12/2023, CONSULTATION DATE:  10/12/2023 REFERRING MD:  Dr. Jens, CHIEF COMPLAINT:  Hypotension   Brief Pt Description / Synopsis:  83 y.o. male who presented for elective ICD Generator change out.  Post procedure complicated by hematoma/bleeding at the pocket site, hypotension, and altered mental status requiring peripheral Levophed .  History of Present Illness:  Henry Mcmahon is a 83 y.o. male with medical history significant for cardiomyopathy s/p biventricular ICD, hypertension, hyperlipidemia, CAD, COPD, alcohol use disorder, gastric ulcer, history of upper GI bleeding, prostate cancer, who presented to the Cath Lab for ICD change out.  Patient was on Plavix prior to this procedure.  After the ICD change out, patient was found to be bleeding around the ICD.  Bleeding was thought to be excessive and unusual so decision was made to keep the patient overnight.  The hospitalist team was consulted to admit the patient.  When [facility] assess the patient in specials, he was surrounded by at least 3 nurses with 1 nurse applying pressure on the ICD generator.  Patient suddenly became unresponsive and his eyes rolled backwards.  Telemetry showed a paced rhythm with heart rate in the 80s.  However, blood pressure dropped to 82/65 and then dropped to systolic BP was 72.  Oxygen saturation was normal on room air.   Normal saline bolus 500 mL was immediately given.  Levophed  infusion was also ordered but was not given because BP improved.  BP came up to 91/65 and subsequently up to a systolic of 121.  Later on, patient became more responsive and started complaining of pain at the ICD site.  However, he was confused and could not provide much information. Dr. Ammon, cardiologist, arrived on the scene.  He did not think that sudden change in mental status was related to the procedure.  Code Stroke was called but cancelled at it was  felt his mentation was due to hypotension.  TR[facility] asked to admit for further workup and treatment.  Please see Significant Hospital Events section below for full detailed hospital course. Pertinent  Medical History   Past Medical History:  Diagnosis Date   Cancer [facility])    prostate   CHF (congestive heart failure) (HCC)    Collagen vascular disease (HCC)    COPD (chronic obstructive pulmonary disease) (HCC)    Coronary artery disease    ETOH abuse    Gastric ulcer    H/O: UGI bleed    History of blood transfusion    HLD (hyperlipidemia)    Hypertension    Myocardial infarction (HCC)    VHD (valvular heart disease)     Micro Data:  07/22: MRSA PCR>>negative 07/22: Blood x2>>NGTD  Antimicrobials:   Anti-infectives (From admission, onward)    Start     Dose/Rate Route Frequency Ordered Stop   10/12/23 2000  piperacillin -tazobactam (ZOSYN ) IVPB 3.375 g        3.375 g 12.5 mL/hr over 240 Minutes Intravenous Every 8 hours 10/12/23 1858     10/12/23 0833  gentamicin  (GARAMYCIN ) 80 mg in sodium chloride  0.9 % 500 mL irrigation  Status:  Discontinued          As needed 10/12/23 0833 10/12/23 1320   10/12/23 0645  ceFAZolin  (ANCEF ) IVPB 2g/100 mL premix        2 g 200 mL/hr over 30 Minutes Intravenous On call 10/12/23 0632 10/12/23 0818   10/12/23 0015  gentamicin  (GARAMYCIN ) 80 mg in sodium chloride  0.9 % 500 mL irrigation  Status:  Discontinued        80 mg Irrigation On call 10/12/23 0004 10/12/23 1320   10/12/23 0000  cephALEXin  (KEFLEX ) 500 MG capsule        500 mg Oral 2 times daily 10/12/23 0907         Significant Hospital Events: Including procedures, antibiotic start and stop dates in addition to other pertinent events   7/22: Presented for elective ICD Generator change-out.  Post-procedure developed hematoma/bleeding at the site.  Became unresponsive, aphasic, and hypotensive with SBP 70-80's, code stroke was called but cancelled as mental status changes felt to be  associated with hypotension,  Ad[facility] PCCM asked to consult due to vasopressors 07/23: Hgb stable @10 .6, however still requiring levophed  gtt @4  mcg/min to maintain map 65 or higher   Interim History / Subjective:  As outlined above under Significant Hospital Events section  Objective   Blood pressure 95/64, pulse 71, temperature 98.2 F (36.8 C), temperature source Oral, resp. rate 12, height 5' 10 (1.778 m), weight 60.4 kg, SpO2 98%.        Intake/Output Summary (Last 24 hours) at 10/13/2023 0746 Last data filed at 10/13/2023 0547 Gross per 24 hour  Intake 983.04 ml  Output 0 ml  Net 983.04 ml   Filed Weights   10/12/23 0651 10/12/23 1331  Weight: 62.6 kg 60.4 kg    Examination: General: Acutely ill-appearing cachetic male resting in bed, NAD on RA  HENT: Atraumatic, normocephalic, neck supple, no JVD Lungs: Clear throughout, even, non labored  Cardiovascular: Paced rhythm, no m/r/g, 2+ radial/1+ distal pulses, no edema  Abdomen: +BS x4, non tender, non distended Extremities: Moves all extremities Neuro: Alert and oriented, following commands, PERRLA  GU: Deferred  Resolved Hospital Problem list   Acute metabolic encephalopathy and possible syncopal episode   Assessment & Plan:   #Shock: suspect hemorrhagic due to left chest pocket hematoma/bleeding following ICD generator change out on 7/22 along with hypovolemia secondary to poor po intake  Hx: Cardiomyopathy - Continuous telemetry monitoring - Prn blood products, iv fluid resuscitation and prn levophed  gtt to maintain map 65 or higher  - HS Troponin negative x2 - Echo 10/12/23: EF 60 to 65% and mild mitral valve regurgitation  - CT Chest negative for hemothorax, revealed left-sided pacemaker battery pack with surrounding high-density fluid, likely hematoma  - Cardiology following appreciate input   #Leukocytosis - Trend WBC and monitor fever curve  - Blood culture x2 NGTD and pending UA collection -  Will continue empiric zosyn  for now pending culture results and sensitivities   #Acute blood loss anemia  - Trend CBC  - Monitor for s/sx of bleeding  - Transfuse for hgb <7   Best Practice (right click and Reselect all SmartList Selections daily)  Diet/type: Regular consistency (see orders) DVT prophylaxis: SCD GI prophylaxis: N/A Lines: N/A Foley:  N/A Code Status:  full code Last date of multidisciplinary goals of care discussion [10/13/23]  07/23: Pt updated regarding plan of care  Labs   CBC: Recent Labs  Lab 10/12/23 1221 10/12/23 1410 10/12/23 1813 10/13/23 0351  WBC 8.8  --  16.1* 8.1  NEUTROABS 6.0  --   --   --   HGB 11.8* 11.8* 11.5* 10.6*  HCT 34.5* 34.7* 33.9* 31.2*  MCV 90.1  --  91.6 88.1  PLT 168  --  173 163    Basic Metabolic  Panel: Recent Labs  Lab 10/12/23 1221 10/13/23 0351  NA 131* 133*  K 3.7 4.3  CL 100 102  CO2 20* 22  GLUCOSE 116* 128*  BUN 12 15  CREATININE 0.49* 0.71  CALCIUM  8.7* 8.8*   GFR: Estimated Creatinine Clearance: 61.9 mL/min (by C-G formula based on SCr of 0.71 mg/dL). Recent Labs  Lab 10/12/23 1221 10/12/23 1340 10/12/23 1813 10/12/23 2124 10/13/23 0351  WBC 8.8  --  16.1*  --  8.1  LATICACIDVEN  --  2.3* 1.6 1.1  --     Liver Function Tests: Recent Labs  Lab 10/12/23 1221  AST 23  ALT 16  ALKPHOS 44  BILITOT 1.3*  PROT 6.1*  ALBUMIN 3.6   No results for input(s): LIPASE, AMYLASE in the last 168 hours. No results for input(s): AMMONIA in the last 168 hours.  ABG No results found for: PHART, PCO2ART, PO2ART, HCO3, TCO2, ACIDBASEDEF, O2SAT   Coagulation Profile: Recent Labs  Lab 10/12/23 1221  INR 1.1    Cardiac Enzymes: No results for input(s): CKTOTAL, CKMB, CKMBINDEX, TROPONINI in the last 168 hours.  HbA1C: Hemoglobin A1C  Date/Time Value Ref Range Status  07/21/2011 04:22 AM 5.1 4.2 - 6.3 % Final    Comment:    The American Diabetes Association  recommends that a primary goal of therapy should be <7% and that physicians should reevaluate the treatment regimen in patients with HbA1c values consistently >8%.     CBG: Recent Labs  Lab 10/12/23 1325  GLUCAP 120*    Review of Systems:   Positives in BOLD: Gen: Denies fever, chills, weight change, fatigue, night sweats HEENT: Denies blurred vision, double vision, hearing loss, tinnitus, sinus congestion, rhinorrhea, sore throat, neck stiffness, dysphagia PULM: Denies shortness of breath, cough, sputum production, hemoptysis, wheezing CV: Denies chest pain, edema, orthopnea, paroxysmal nocturnal dyspnea, palpitations GI: Denies abdominal pain, nausea, vomiting, diarrhea, hematochezia, melena, constipation, change in bowel habits GU: Denies dysuria, hematuria, polyuria, oliguria, urethral discharge Endocrine: Denies hot or cold intolerance, polyuria, polyphagia or appetite change Derm: Denies rash, dry skin, scaling or peeling skin change Heme: Denies easy bruising, bleeding, bleeding gums Neuro: Denies headache, numbness, weakness, slurred speech, loss of memory or consciousness  Past Medical History:  He,  has a past medical history of Cancer (HCC), CHF (congestive heart failure) (HCC), Collagen vascular disease (HCC), COPD (chronic obstructive pulmonary disease) (HCC), Coronary artery disease, ETOH abuse, Gastric ulcer, H/O: UGI bleed, History of blood transfusion, HLD (hyperlipidemia), Hypertension, Myocardial infarction (HCC), and VHD (valvular heart disease).   Surgical History:   Past Surgical History:  Procedure Laterality Date   COLON SURGERY     COLONOSCOPY     CORONARY ARTERY BYPASS GRAFT     HEMORRHOID SURGERY     ICD GENERATOR CHANGEOUT N/A 10/12/2023   Procedure: ICD GENERATOR CHANGEOUT;  Surgeon: Ammon Blunt, MD;  Location: [facility] INVASIVE CV LAB;  Service: Cardiovascular;  Laterality: N/A;   INSERT / REPLACE / REMOVE PACEMAKER     LUMBAR DISC SURGERY      MICROLARYNGOSCOPY N/A 02/19/2016   Procedure: MICROLARYNGOSCOPY;  Surgeon: Deward Dolly, MD;  Location: [facility] ORS;  Service: ENT;  Laterality: N/A;   UPPER GASTROINTESTINAL ENDOSCOPY       Social History:   reports that he has been smoking cigarettes. He has never used smokeless tobacco. He reports current alcohol use of about 36.0 standard drinks of alcohol per week. He reports that he does not use drugs.   Family History:  His family history is not on file.   Allergies No Known Allergies   Home Medications  Prior to Admission medications   Medication Sig Start Date End Date Taking? Authorizing Provider  atorvastatin  (LIPITOR) 40 MG tablet Take 40 mg by mouth at bedtime. 08/09/14  Yes [provider]  cephALEXin  (KEFLEX ) 500 MG capsule Take 1 capsule (500 mg total) by mouth 2 (two) times daily. 10/12/23  Yes Paraschos, Alexander, MD  cholecalciferol (VITAMIN D3) 25 MCG (1000 UNIT) tablet Take 1,000 Units by mouth daily.   Yes [provider]  clopidogrel (PLAVIX) 75 MG tablet Take 75 mg by mouth daily. 01/17/16  Yes [provider]  furosemide (LASIX) 20 MG tablet Take 20 mg by mouth daily. 11/06/19  Yes [provider]  isosorbide mononitrate (IMDUR) 30 MG 24 hr tablet 30 mg daily. 05/03/19  Yes [provider]  lisinopril (ZESTRIL) 10 MG tablet Take 10 mg by mouth daily. 01/13/16  Yes [provider]  nitroGLYCERIN (NITROSTAT) 0.4 MG SL tablet Place 0.4 mg under the tongue every 5 (five) minutes as needed for chest pain.   Yes [provider]  pantoprazole  (PROTONIX ) 20 MG tablet Take 20 mg by mouth daily. 01/02/16  Yes [provider]  tamsulosin (FLOMAX) 0.4 MG CAPS capsule Take 0.4 mg by mouth daily.   Yes [provider]  vitamin B-12 (CYANOCOBALAMIN) 500 MCG tablet Take 500 mcg by mouth daily.   Yes [provider]     Critical care time: 35 minutes    Lonell Moose, AGNP   Pulmonary/Critical Care Pager [phone] (please enter 7 digits) PCCM Consult Pager [phone] (please enter 7 digits)

## 2023-10-13 NOTE — Plan of Care (Signed)
  Problem: Education: Goal: Knowledge of disease or condition will improve Outcome: Progressing   Problem: Cardiac: Goal: Will achieve and/or maintain hemodynamic stability Outcome: Progressing   Problem: Clinical Measurements: Goal: Postoperative complications will be avoided or minimized Outcome: Progressing   Problem: Respiratory: Goal: Respiratory status will improve Outcome: Progressing   Problem: Skin Integrity: Goal: Wound healing without signs and symptoms of infection Outcome: Progressing   Problem: Activity: Goal: Risk for activity intolerance will decrease Outcome: Not Progressing   Problem: Urinary Elimination: Goal: Ability to achieve and maintain adequate renal perfusion and functioning will improve Outcome: Not Progressing

## 2023-10-13 NOTE — Progress Notes (Signed)
 [facility] CLINIC CARDIOLOGY PROGRESS NOTE       Patient ID: Henry Mcmahon MRN: [id number] DOB/AGE: 1941/01/24 83 y.o.  Admit date: 10/12/2023 Referring Physician Dr. Jens Primary Physician Borun, Marsa Hacker, MD Primary Cardiologist Dr. Dewane  Reason for Consultation Hematoma s/p ICD generator changeout  HPI: Henry Mcmahon is a 83 y.o. male  with a past medical history of CAD s/p 3v CABG in 2002 (LIMA to LAD, SVG to ramus, SVG to D1) and multiple stents, chronic HFmrEF s/p Biventricular ICD, hypertension, hyperlipidemia who presented for outpatient ICD generator change out on 10/12/2023 due to device at [facility] at pacer clinic.  Patient underwent ICD generator change out with Dr. Nolen s/p revealed significant hematoma, active bleeding and swelling. Of note patient was taking Plavix at home. In the specials recovery event, while holding pressure on incision site for about 10 minutes and patient suddenly became hypotensive, not responding to questions appropriately, not making eye contact.  Code stroke was called and patient started on IV fluids, BP improved and patient became more responsive and started to answer questions appropriately, code stroke canceled at this point.  Patient then transferred to stepdown.  Interval History: -Patient seen and examined this AM and laying comfortably in hospital bed. Patient states he feels a lot better today and denies chest pain, SOB, lightheadedness or any pain.  -Patients BP borderline, requiring levo gtt and HR stable this AM. Overnight Tele showed no significant events.  Patient is adequately pacing. -Patient appears euvolemic. -Patient remains on room air with stable SpO2.  -Left chest incision site appears stable, significant improvement in swelling. -Hemoglobin stable. -Chest CT reveals no evidence of pneumothorax or hemothorax.   Review of systems complete and found to be negative unless listed above    Past Medical History:   Diagnosis Date   Cancer [facility])    prostate   CHF (congestive heart failure) (HCC)    Collagen vascular disease (HCC)    COPD (chronic obstructive pulmonary disease) (HCC)    Coronary artery disease    ETOH abuse    Gastric ulcer    H/O: UGI bleed    History of blood transfusion    HLD (hyperlipidemia)    Hypertension    Myocardial infarction (HCC)    VHD (valvular heart disease)     Past Surgical History:  Procedure Laterality Date   COLON SURGERY     COLONOSCOPY     CORONARY ARTERY BYPASS GRAFT     HEMORRHOID SURGERY     ICD GENERATOR CHANGEOUT N/A 10/12/2023   Procedure: ICD GENERATOR CHANGEOUT;  Surgeon: Ammon Marsa, MD;  Location: [facility] INVASIVE CV LAB;  Service: Cardiovascular;  Laterality: N/A;   INSERT / REPLACE / REMOVE PACEMAKER     LUMBAR DISC SURGERY     MICROLARYNGOSCOPY N/A 02/19/2016   Procedure: MICROLARYNGOSCOPY;  Surgeon: Deward Dolly, MD;  Location: [facility] ORS;  Service: ENT;  Laterality: N/A;   UPPER GASTROINTESTINAL ENDOSCOPY      Medications Prior to Admission  Medication Sig Dispense Refill Last Dose/Taking   atorvastatin  (LIPITOR) 40 MG tablet Take 40 mg by mouth at bedtime.   10/11/2023   cholecalciferol (VITAMIN D3) 25 MCG (1000 UNIT) tablet Take 1,000 Units by mouth daily.   10/12/2023 Morning   clopidogrel (PLAVIX) 75 MG tablet Take 75 mg by mouth daily.   10/12/2023 Morning   furosemide (LASIX) 20 MG tablet Take 20 mg by mouth daily.   10/12/2023 Morning   isosorbide mononitrate (IMDUR) 30 MG  24 hr tablet 30 mg daily.   10/12/2023 Morning   lisinopril (ZESTRIL) 10 MG tablet Take 10 mg by mouth daily.   10/12/2023 Morning   nitroGLYCERIN (NITROSTAT) 0.4 MG SL tablet Place 0.4 mg under the tongue every 5 (five) minutes as needed for chest pain.   Taking As Needed   pantoprazole  (PROTONIX ) 20 MG tablet Take 20 mg by mouth daily.   10/12/2023 Morning   tamsulosin (FLOMAX) 0.4 MG CAPS capsule Take 0.4 mg by mouth daily.   10/12/2023 Morning   vitamin B-12  (CYANOCOBALAMIN) 500 MCG tablet Take 500 mcg by mouth daily.   10/12/2023 Morning   Social History   Socioeconomic History   Marital status: Single    Spouse name: Not on file   Number of children: Not on file   Years of education: Not on file   Highest education level: Not on file  Occupational History   Not on file  Tobacco Use   Smoking status: Every Day    Current packs/day: 0.25    Types: Cigarettes   Smokeless tobacco: Never  Substance and Sexual Activity   Alcohol use: Yes    Alcohol/week: 36.0 standard drinks of alcohol    Types: 36 Cans of beer per week   Drug use: No   Sexual activity: Not on file  Other Topics Concern   Not on file  Social History Narrative   Not on file   Social Drivers of Health   Financial Resource Strain: Low Risk  (03/31/2023)   Received from [facility]   Overall Financial Resource Strain (CARDIA)    Difficulty of Paying Living Expenses: Not hard at all  Food Insecurity: Patient Declined (10/12/2023)   Hunger Vital Sign    Worried About Running Out of Food in the Last Year: Patient declined    Ran Out of Food in the Last Year: Patient declined  Transportation Needs: Patient Declined (10/12/2023)   PRAPARE - Administrator, Civil Service (Medical): Patient declined    Lack of Transportation (Non-Medical): Patient declined  Physical Activity: Not on file  Stress: Not on file  Social Connections: Patient Declined (10/12/2023)   Social Connection and Isolation Panel    Frequency of Communication with Friends and Family: Patient declined    Frequency of Social Gatherings with Friends and Family: Patient declined    Attends Religious Services: Patient declined    Database administrator or Organizations: Patient declined    Attends Banker Meetings: Patient declined    Marital Status: Patient declined  Intimate Partner Violence: Patient Declined (10/12/2023)   Humiliation, Afraid, Rape, and Kick  questionnaire    Fear of Current or Ex-Partner: Patient declined    Emotionally Abused: Patient declined    Physically Abused: Patient declined    Sexually Abused: Patient declined    History reviewed. No pertinent family history.   Vitals:   10/13/23 1230 10/13/23 1300 10/13/23 1400 10/13/23 1500  BP: (!) 108/53 (!) 113/57 105/88 (!) 89/51  Pulse: 66 69 73 71  Resp: 11 20 (!) 21 12  Temp:      TempSrc:      SpO2: 96% 96% 91% 94%  Weight:      Height:        PHYSICAL EXAM General: Chronically ill appearing elderly male, well nourished, in no acute distress. HEENT: Normocephalic and atraumatic. Neck: No JVD.   Lungs: Normal respiratory effort on room air. Clear bilaterally to auscultation. No  wheezes, crackles, rhonchi.  Heart: HRRR. Normal S1 and S2 without gallops or murmurs.  Abdomen: Non-distended appearing.  Msk: Normal strength and tone for age. Extremities: Warm and well perfused. No clubbing, cyanosis, edema.  Neuro: Alert and oriented X 3. Psych: Answers questions appropriately.   Labs: Basic Metabolic Panel: Recent Labs    10/12/23 1221 10/13/23 0351  NA 131* 133*  K 3.7 4.3  CL 100 102  CO2 20* 22  GLUCOSE 116* 128*  BUN 12 15  CREATININE 0.49* 0.71  CALCIUM  8.7* 8.8*   Liver Function Tests: Recent Labs    10/12/23 1221  AST 23  ALT 16  ALKPHOS 44  BILITOT 1.3*  PROT 6.1*  ALBUMIN 3.6   No results for input(s): LIPASE, AMYLASE in the last 72 hours. CBC: Recent Labs    10/12/23 1221 10/12/23 1410 10/12/23 1813 10/13/23 0351  WBC 8.8  --  16.1* 8.1  NEUTROABS 6.0  --   --   --   HGB 11.8*   < > 11.5* 10.6*  HCT 34.5*   < > 33.9* 31.2*  MCV 90.1  --  91.6 88.1  PLT 168  --  173 163   < > = values in this interval not displayed.   Cardiac Enzymes: Recent Labs    10/12/23 1221 10/12/23 1410  TROPONINIHS 9 12   BNP: No results for input(s): BNP in the last 72 hours. D-Dimer: No results for input(s): DDIMER in the last 72  hours. Hemoglobin A1C: No results for input(s): HGBA1C in the last 72 hours. Fasting Lipid Panel: No results for input(s): CHOL, HDL, LDLCALC, TRIG, CHOLHDL, LDLDIRECT in the last 72 hours. Thyroid Function Tests: No results for input(s): TSH, T4TOTAL, T3FREE, THYROIDAB in the last 72 hours.  Invalid input(s): FREET3 Anemia Panel: Recent Labs    10/12/23 1410  FERRITIN 22*  TIBC 333  IRON 60     Radiology: ECHOCARDIOGRAM COMPLETE Result Date: 10/13/2023    ECHOCARDIOGRAM REPORT   Patient Name:   Henry Mcmahon Date of Exam: 10/12/2023 Medical Rec #:  [id number]      Height:       69.0 in Accession #:    [id number]     Weight:       138.0 lb Date of Birth:  Jun 01, 1940       BSA:          1.765 m Patient Age:    83 years       BP:           101/62 mmHg Patient Gender: M              HR:           81 bpm. Exam Location:  [facility] Procedure: 2D Echo, Cardiac Doppler and Color Doppler (Both Spectral and Color            Flow Doppler were utilized during procedure). Indications:     Syncope R55                  Cardiomyopathy-unspecified I42.9  History:         Patient has no prior history of Echocardiogram examinations.                  CHF, Previous Myocardial Infarction, COPD; Risk                  Factors:Hypertension.  Sonographer:     Christopher Furnace Referring Phys:  [phone] BERNARD  AYIKU Diagnosing Phys: Marsa Dooms MD  Sonographer Comments: Technically difficult study due to poor echo windows, Technically challenging study due to limited acoustic windows, no apical window and no subcostal window. IMPRESSIONS  1. Left ventricular ejection fraction, by estimation, is 60 to 65%. The left ventricle has normal function. The left ventricle has no regional wall motion abnormalities. Left ventricular diastolic parameters were normal.  2. Right ventricular systolic function is normal. The right ventricular size is normal.  3. The mitral valve is normal in structure. Mild mitral valve  regurgitation. No evidence of mitral stenosis.  4. The aortic valve is normal in structure. Aortic valve regurgitation is not visualized. No aortic stenosis is present.  5. There is mild dilatation of the aortic root, measuring 43 mm.  6. The inferior vena cava is normal in size with greater than 50% respiratory variability, suggesting right atrial pressure of 3 mmHg. FINDINGS  Left Ventricle: Left ventricular ejection fraction, by estimation, is 60 to 65%. The left ventricle has normal function. The left ventricle has no regional wall motion abnormalities. Strain was performed and the global longitudinal strain is indeterminate. The left ventricular internal cavity size was normal in size. There is no left ventricular hypertrophy. Left ventricular diastolic parameters were normal. Right Ventricle: The right ventricular size is normal. No increase in right ventricular wall thickness. Right ventricular systolic function is normal. Left Atrium: Left atrial size was normal in size. Right Atrium: Right atrial size was normal in size. Pericardium: There is no evidence of pericardial effusion. Mitral Valve: The mitral valve is normal in structure. Mild mitral valve regurgitation. No evidence of mitral valve stenosis. Tricuspid Valve: The tricuspid valve is normal in structure. Tricuspid valve regurgitation is mild . No evidence of tricuspid stenosis. Aortic Valve: The aortic valve is normal in structure. Aortic valve regurgitation is not visualized. No aortic stenosis is present. Pulmonic Valve: The pulmonic valve was normal in structure. Pulmonic valve regurgitation is not visualized. No evidence of pulmonic stenosis. Aorta: The aortic root is normal in size and structure. There is mild dilatation of the aortic root, measuring 43 mm. Venous: The inferior vena cava is normal in size with greater than 50% respiratory variability, suggesting right atrial pressure of 3 mmHg. IAS/Shunts: No atrial level shunt detected by  color flow Doppler. Additional Comments: 3D was performed not requiring image post processing on an independent workstation and was indeterminate.  LEFT VENTRICLE PLAX 2D LVIDd:         3.30 cm LVIDs:         2.20 cm LV PW:         1.50 cm LV IVS:        1.60 cm LVOT diam:     2.30 cm LVOT Area:     4.15 cm  LEFT ATRIUM         Index LA diam:    3.80 cm 2.15 cm/m   AORTA Ao Root diam: 4.10 cm  SHUNTS Systemic Diam: 2.30 cm Marsa Dooms MD Electronically signed by Marsa Dooms MD Signature Date/Time: 10/13/2023/8:09:42 AM    Final    CT CHEST WO CONTRAST Result Date: 10/12/2023 CLINICAL DATA:  Provided history: Chest wall pain, nontraumatic, no prior imaging Hematoma post pacemaker generator change out, rule out hemothorax. EXAM: CT CHEST WITHOUT CONTRAST TECHNIQUE: Multidetector CT imaging of the chest was performed following the standard protocol without IV contrast. RADIATION DOSE REDUCTION: This exam was performed according to the departmental dose-optimization program which includes automated exposure  control, adjustment of the mA and/or kV according to patient size and/or use of iterative reconstruction technique. COMPARISON:  Chest radiograph 04/26/2012.  Chest CT 07/25/2009 FINDINGS: Cardiovascular: Left subclavian pacemaker with lead tips in the right atrium, right ventricle and coronary sinus, moderate associated streak artifact. No pericardial effusion. The heart is normal in size. Native coronary artery calcifications, post CABG. Aortic atherosclerosis. 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 Descending aorta is tortuous. No periaortic stranding. Mediastinum/Nodes: Small mediastinal lymph nodes are not enlarged by size criteria. Limited hilar assessment on this unenhanced exam. Patulous esophagus contains small amount of intraluminal fluid. Small hiatal hernia Lungs/Pleura: Moderate emphysema. No pneumothorax. No pleural fluid. Scattered areas of subsegmental atelectasis or scarring.  Subpleural reticulation within the anterior lungs, chronic and likely related to scarring. No features of pulmonary edema. No suspicious pulmonary nodule. Upper Abdomen: Symmetric perinephric edema is partially included. Gallstone. Musculoskeletal: Left-sided pacemaker battery pack with surrounding regional streak artifact. High-density fluid surrounding the battery pack is best evaluated inferiorly spanning 8.4 cm transverse, series 2, image 79. Subcutaneous gas within the collection is most prominent medially. Surrounding soft tissue edema extends inferiorly in the anterior chest wall, as well as superiorly into the supraclavicular soft tissues. Prior median sternotomy. Remote bilateral rib fractures. No acute osseous findings. IMPRESSION: 1. Left-sided pacemaker battery pack with surrounding high-density fluid, likely hematoma. Subcutaneous gas within the collection is most prominent medially. Surrounding soft tissue edema extends inferiorly in the anterior chest wall, as well as superiorly into the supraclavicular soft tissues. Sterility is technically indeterminate by imaging. 2. No pleural fluid, hemothorax or pneumothorax. 3. Emphysema. 4. Dilated ascending aorta at 4.1 cm. Recommend annual imaging followup by CTA or MRA. This recommendation follows 2010 ACCF/AHA/AATS/ACR/ASA/SCA/SCAI/SIR/STS/SVM Guidelines for the Diagnosis and Management of Patients with Thoracic Aortic Disease. Circulation. 2010; 121: Z733-z630. Aortic aneurysm NOS (ICD10-I71.9) Aortic Atherosclerosis (ICD10-I70.0) and Emphysema (ICD10-J43.9). Electronically Signed   By: Andrea Gasman M.D.   On: 10/12/2023 19:33   EP PPM/ICD IMPLANT Result Date: 10/12/2023 Successful BiV ICD generator change out (Medtronic Cobalt XT HF Quad)    ECHO as above.  TELEMETRY reviewed by me 10/13/2023: ventricular paced, rate 80s  EKG reviewed by me: ordered  Data reviewed by me 10/13/2023: last 24h vitals tele labs imaging I/O hospitalist note,  critical care notes.  Principal Problem:   ICD (implantable cardioverter-defibrillator) pocket hematoma Active Problems:   Hypotension   Syncope   Shock circulatory (HCC)   Protein-calorie malnutrition, severe    ASSESSMENT AND PLAN:  Henry Mcmahon is a 83 y.o. male  with a past medical history of CAD s/p 3v CABG in 2002 (LIMA to LAD, SVG to ramus, SVG to D1) and multiple stents, chronic HFmrEF s/p Biventricular ICD, hypertension, hyperlipidemia who presented for outpatient ICD generator change out on 10/12/2023 due to device at [facility] at pacer clinic.  Patient underwent ICD generator change out with Dr. Nolen s/p revealed significant hematoma, active bleeding and swelling. Of note patient was taking Plavix at home. In the specials recovery event, while holding pressure on incision site for about 10 minutes and patient suddenly became hypotensive, not responding to questions appropriately, not making eye contact.  Code stroke was called and patient started on IV fluids, BP improved and patient became more responsive and started to answer questions appropriately, code stroke canceled at this point.  Patient then transferred to stepdown.  # Shock # CAD s/p CABG x 3 and multiple stents (last [facility] 2016) # s/p Biventricular ICD  #  AMS s/p generator changeout (07/22) # Chronic HFmrEF Patients presents for outpatient ICD generator changeout, at incision site had complications with hematoma and swelling. Trops negative x2.  Lactic elevated at 2.3. Patient appears euvolemic. BP borderline without pressors, improved s/p IVF.  Hemoglobin stable.  Echo this admission with preserved EF of 60-65%, no RWMA.  Per telemetry adequately ventricular paced. -Will continue to monitor left chest hematoma.  Much improved, able. -Hold Plavix. Do not resume at discharge, will be discussed at outpatient cardiology follow-up. -Continue atorvastatin  40 mg daily.  -Hold home Imdur, lisinopril, lasix as BP is borderline  requiring pressors at this time.   -Continue midodrine  10 mg 3 times daily.  Wean when able, Recommend MAP > 65. -Agree with critical care management.  This patient's plan of care was discussed and created with Dr. Ammon and he is in agreement.  Signed: Dorene Comfort, PA-C  10/13/2023, 3:31 PM [facility] Cardiology

## 2023-10-13 NOTE — Progress Notes (Signed)
 Alisa updated by this RN within RN scope of practice. Alisa provided password prior to update.

## 2023-10-13 NOTE — Progress Notes (Addendum)
 Initial Nutrition Assessment  DOCUMENTATION CODES:   Severe malnutrition in context of chronic illness  INTERVENTION:   Ensure Plus High Protein po TID, each supplement provides 350 kcal and 20 grams of protein  MVI, thiamine  and folic acid  po daily   Dysphagia 3 diet   Pt at high refeed risk; recommend monitor potassium, magnesium  and phosphorus labs daily until stable  Daily weights   NUTRITION DIAGNOSIS:   Severe Malnutrition related to chronic illness as evidenced by severe fat depletion, severe muscle depletion.  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  Met with pt in room today. Pt is anxious to go home. Pt reports poor appetite and oral intake at baseline. Pt reports that he does not usually eat breakfast until late morning. Pt reports that his sister will often cook him eggs with bacon or pancakes for breakfast. Pt is edentulous and requires a mechanical soft diet at home. Pt reports that he does drink chocolate Ensure at home. Pt reports that he did not eat any breakfast this morning. Pt documented to have eaten 80% of his lunch yesterday. RD discussed with pt the importance of adequate nutrition needed to preserve lean muscle. RD will add supplements and MVI to help pt meet his estimated needs. Pt is likely at refeed risk. Per chart, pt appears weight stable at baseline. Pt reports his UBW is  ~130-135lbs.   Medications reviewed and include: midodrine , MVI, protonix , LRS @40ml /hr, levophed , zosyn    Labs reviewed: Na 133(L), K 4.3 wnl Hgb 10.6(L), Hct 31.2(L)  NUTRITION - FOCUSED PHYSICAL EXAM:  Flowsheet Row Most Recent Value  Orbital Region Moderate depletion  Upper Arm Region Severe depletion  Thoracic and Lumbar Region Severe depletion  Buccal Region Severe depletion  Temple Region Severe depletion  Clavicle Bone Region Severe depletion  Clavicle and Acromion Bone Region Severe depletion  Scapular Bone Region Severe depletion  Dorsal Hand Severe depletion  Patellar Region Severe depletion  Anterior Thigh Region Severe depletion  Posterior Calf Region Severe depletion  Edema (RD Assessment) None  Hair Reviewed  Eyes Reviewed  Mouth Reviewed  Skin Reviewed  Nails Reviewed   Diet Order:   Diet Order             Diet Heart Room service appropriate? Yes; Fluid consistency: Thin  Diet effective now                  EDUCATION NEEDS:   Education needs have been addressed  Skin:  Skin Assessment: Reviewed RN Assessment (ecchymosis)  Last BM:  pta  Height:   Ht Readings from Last 1 Encounters:  10/12/23 5' 10 (1.778 m)    Weight:   Wt Readings from Last 1 Encounters:  10/12/23 60.4 kg    Ideal Body Weight:  75.45 kg  BMI:  Body mass index is 19.11 kg/m.  Estimated Nutritional Needs:   Kcal:  1700-2000kcal/day  Protein:  85-100g/day  Fluid:  1.6-1.8L/day  Augustin Shams MS, RD, LDN If unable to be reached, please send secure chat to RD inpatient available from 8:00a-4:00p daily

## 2023-10-13 NOTE — Plan of Care (Signed)
  Problem: Education: Goal: Knowledge of cardiac device and self-care will improve Outcome: Progressing   Problem: Education: Goal: Knowledge of General Education information will improve Description: Including pain rating scale, medication(s)/side effects and non-pharmacologic comfort measures Outcome: Progressing   Problem: Clinical Measurements: Goal: Ability to maintain clinical measurements within normal limits will improve Outcome: Progressing   Problem: Education: Goal: Ability to safely manage health related needs after discharge will improve Outcome: Not Progressing   Problem: Activity: Goal: Risk for activity intolerance will decrease Outcome: Not Progressing   Problem: Coping: Goal: Level of anxiety will decrease Outcome: Not Progressing   Problem: Elimination: Goal: Will not experience complications related to bowel motility Outcome: Not Progressing Goal: Will not experience complications related to urinary retention Outcome: Not Progressing   Problem: Pain Managment: Goal: General experience of comfort will improve and/or be controlled Outcome: Not Progressing

## 2023-10-13 NOTE — Plan of Care (Signed)
  Problem: Cardiac: Goal: Ability to achieve and maintain adequate cardiopulmonary perfusion will improve Outcome: Progressing   Problem: Clinical Measurements: Goal: Ability to maintain clinical measurements within normal limits will improve Outcome: Progressing Goal: Will remain free from infection Outcome: Progressing Goal: Diagnostic test results will improve Outcome: Progressing Goal: Respiratory complications will improve Outcome: Progressing Goal: Cardiovascular complication will be avoided Outcome: Progressing   Problem: Nutrition: Goal: Adequate nutrition will be maintained Outcome: Progressing   Problem: Elimination: Goal: Will not experience complications related to bowel motility Outcome: Progressing   Problem: Pain Managment: Goal: General experience of comfort will improve and/or be controlled Outcome: Progressing   Problem: Safety: Goal: Ability to remain free from injury will improve Outcome: Progressing   Problem: Skin Integrity: Goal: Risk for impaired skin integrity will decrease Outcome: Progressing   Problem: Education: Goal: Knowledge of cardiac device and self-care will improve Outcome: Not Progressing Goal: Ability to safely manage health related needs after discharge will improve Outcome: Not Progressing   Problem: Education: Goal: Knowledge of General Education information will improve Description: Including pain rating scale, medication(s)/side effects and non-pharmacologic comfort measures Outcome: Not Progressing   Problem: Health Behavior/Discharge Planning: Goal: Ability to manage health-related needs will improve Outcome: Not Progressing   Problem: Activity: Goal: Risk for activity intolerance will decrease Outcome: Not Progressing   Problem: Elimination: Goal: Will not experience complications related to urinary retention Outcome: Not Progressing

## 2023-10-14 ENCOUNTER — Inpatient Hospital Stay

## 2023-10-14 DIAGNOSIS — R55 Syncope and collapse: Secondary | ICD-10-CM

## 2023-10-14 DIAGNOSIS — I959 Hypotension, unspecified: Secondary | ICD-10-CM | POA: Diagnosis not present

## 2023-10-14 DIAGNOSIS — E43 Unspecified severe protein-calorie malnutrition: Secondary | ICD-10-CM

## 2023-10-14 DIAGNOSIS — T82837A Hemorrhage of cardiac prosthetic devices, implants and grafts, initial encounter: Secondary | ICD-10-CM | POA: Diagnosis not present

## 2023-10-14 LAB — MAGNESIUM: Magnesium: 1.5 mg/dL — ABNORMAL LOW (ref 1.7–2.4)

## 2023-10-14 LAB — CBC WITH DIFFERENTIAL/PLATELET
Abs Immature Granulocytes: 0.03 K/uL (ref 0.00–0.07)
Basophils Absolute: 0 K/uL (ref 0.0–0.1)
Basophils Relative: 1 %
Eosinophils Absolute: 0.1 K/uL (ref 0.0–0.5)
Eosinophils Relative: 1 %
HCT: 27.6 % — ABNORMAL LOW (ref 39.0–52.0)
Hemoglobin: 9.4 g/dL — ABNORMAL LOW (ref 13.0–17.0)
Immature Granulocytes: 0 %
Lymphocytes Relative: 14 %
Lymphs Abs: 1.1 K/uL (ref 0.7–4.0)
MCH: 30.5 pg (ref 26.0–34.0)
MCHC: 34.1 g/dL (ref 30.0–36.0)
MCV: 89.6 fL (ref 80.0–100.0)
Monocytes Absolute: 1 K/uL (ref 0.1–1.0)
Monocytes Relative: 12 %
Neutro Abs: 5.9 K/uL (ref 1.7–7.7)
Neutrophils Relative %: 72 %
Platelets: 141 K/uL — ABNORMAL LOW (ref 150–400)
RBC: 3.08 MIL/uL — ABNORMAL LOW (ref 4.22–5.81)
RDW: 15.2 % (ref 11.5–15.5)
WBC: 8.2 K/uL (ref 4.0–10.5)
nRBC: 0 % (ref 0.0–0.2)

## 2023-10-14 LAB — BASIC METABOLIC PANEL WITH GFR
Anion gap: 7 (ref 5–15)
BUN: 11 mg/dL (ref 8–23)
CO2: 24 mmol/L (ref 22–32)
Calcium: 8.5 mg/dL — ABNORMAL LOW (ref 8.9–10.3)
Chloride: 103 mmol/L (ref 98–111)
Creatinine, Ser: 0.58 mg/dL — ABNORMAL LOW (ref 0.61–1.24)
GFR, Estimated: >60 mL/min (ref >60)
Glucose, Bld: 79 mg/dL (ref 70–99)
Potassium: 4.1 mmol/L (ref 3.5–5.1)
Sodium: 134 mmol/L — ABNORMAL LOW (ref 135–145)

## 2023-10-14 LAB — PHOSPHORUS: Phosphorus: 2.3 mg/dL — ABNORMAL LOW (ref 2.5–4.6)

## 2023-10-14 LAB — GLUCOSE, CAPILLARY: Glucose-Capillary: 79 mg/dL (ref 70–99)

## 2023-10-14 MED ORDER — MAGNESIUM SULFATE 4 GM/100ML IV SOLN
4.0000 g | Freq: Once | INTRAVENOUS | Status: AC
  Administered 2023-10-14: 4 g via INTRAVENOUS
  Filled 2023-10-14: qty 100

## 2023-10-14 MED ORDER — SODIUM PHOSPHATES 45 MMOLE/15ML IV SOLN
15.0000 mmol | Freq: Once | INTRAVENOUS | Status: AC
  Administered 2023-10-14: 15 mmol via INTRAVENOUS
  Filled 2023-10-14: qty 5

## 2023-10-14 NOTE — Evaluation (Signed)
 Physical Therapy Evaluation Patient Details Name: Henry Mcmahon MRN: [id number] DOB: 11-Jul-1940 Today's Date: 10/14/2023  History of Present Illness  Henry Mcmahon is a 83 y.o. male with medical history significant for cardiomyopathy s/p biventricular ICD, hypertension, hyperlipidemia, CAD, COPD, alcohol use disorder, gastric ulcer, history of upper GI bleeding, prostate cancer, who presented to the Cath Lab for ICD change out.  Clinical Impression  Pt is a 83 year old male who was admitted for ICD pocket hematoma. Pt performs bed mobility with CGA to min a, requiring min verbal cueing for mobility. Pt HOH and easily agitated, R ear better than L. STS transfer this date with no AD use and CGA, pt stating he has RW/WC available at home but uses neither, unsure of validity.  Ambulation x15' with no AD use, pt impulsive at times and requiring multimodal cueing to prevent LOB d/t lines/leads management, pt stating his toes hurt in bilat feet when ambulating, place slippers on next session. Pt demonstrates deficits with strength, balance, and activity tolerance impacting his current QoL. Pt will benefit from skilled PT interventions to address deficits and meet goals. PT to follow acutely as appropriate.         If plan is discharge home, recommend the following: A little help with walking and/or transfers;A little help with bathing/dressing/bathroom;Assistance with cooking/housework;Direct supervision/assist for medications management;Assist for transportation;Help with stairs or ramp for entrance   Can travel by private vehicle        Equipment Recommendations None recommended by PT  Recommendations for Other Services       Functional Status Assessment Patient has had a recent decline in their functional status and demonstrates the ability to make significant improvements in function in a reasonable and predictable amount of time.     Precautions / Restrictions Precautions Precautions:  Fall Recall of Precautions/Restrictions: Impaired Precaution/Restrictions Comments: L UE limited movement Restrictions Weight Bearing Restrictions Per Provider Order: No      Mobility  Bed Mobility Overal bed mobility: Needs Assistance Bed Mobility: Supine to Sit     Supine to sit: Contact guard, Supervision     General bed mobility comments: CGA to supervision with bed mobility, minimal verbal cueing for mobility    Transfers Overall transfer level: Needs assistance Equipment used: None Transfers: Sit to/from Stand Sit to Stand: Contact guard assist           General transfer comment: CGA, no LOB, pt impulsive and moves when he wants, no AD use, assistance with lines/leads    Ambulation/Gait Ambulation/Gait assistance: Contact guard assist, Min assist Gait Distance (Feet): 15 Feet Assistive device: None Gait Pattern/deviations: Step-through pattern, Decreased step length - right, Decreased step length - left, Narrow base of support       General Gait Details: CGA to min a d/t impulsiveness, assistance with lines/leads, no LOB put pt unable to follow directions towards end of distance  Stairs            Wheelchair Mobility     Tilt Bed    Modified Rankin (Stroke Patients Only)       Balance Overall balance assessment: Needs assistance Sitting-balance support: Single extremity supported, Feet supported Sitting balance-Leahy Scale: Fair Sitting balance - Comments: sitting EOB   Standing balance support: No upper extremity supported Standing balance-Leahy Scale: Fair Standing balance comment: standing with no AD but impulsive at times  Pertinent Vitals/Pain Pain Assessment Pain Assessment: No/denies pain    Home Living Family/patient expects to be discharged to:: Private residence Living Arrangements: Other relatives (sisters) Available Help at Discharge: Family Type of Home: House Home Access: Stairs  to enter;Ramped entrance Entrance Stairs-Rails: None Entrance Stairs-Number of Steps: 3   Home Layout: One level Home Equipment: Agricultural consultant (2 wheels);Wheelchair - manual      Prior Function Prior Level of Function : Needs assist       Physical Assist : Mobility (physical);ADLs (physical) Mobility (physical): Gait;Transfers ADLs (physical): Bathing;Dressing;Toileting;IADLs Mobility Comments: states he has RW/WC, states he doesnt use it but unclear how accurate that is ADLs Comments: has sisters help with bathing and IADLs     Extremity/Trunk Assessment   Upper Extremity Assessment Upper Extremity Assessment: Generalized weakness    Lower Extremity Assessment Lower Extremity Assessment: Overall WFL for tasks assessed    Cervical / Trunk Assessment Cervical / Trunk Assessment: Kyphotic  Communication   Communication Communication: Impaired Factors Affecting Communication: Hearing impaired    Cognition Arousal: Alert Behavior During Therapy: Agitated, Restless, Impulsive   PT - Cognitive impairments: No family/caregiver present to determine baseline                         Following commands: Impaired Following commands impaired: Follows one step commands inconsistently, Follows multi-step commands inconsistently     Cueing Cueing Techniques: Verbal cues, Tactile cues, Gestural cues     General Comments      Exercises     Assessment/Plan    PT Assessment Patient needs continued PT services  PT Problem List Decreased range of motion;Decreased activity tolerance;Decreased strength;Decreased balance;Decreased mobility;Decreased coordination;Decreased knowledge of use of DME;Decreased safety awareness       PT Treatment Interventions DME instruction;Gait training;Stair training;Functional mobility training;Therapeutic activities;Therapeutic exercise;Balance training;Patient/family education;Wheelchair mobility training    PT Goals (Current goals  can be found in the Care Plan section)  Acute Rehab PT Goals Patient Stated Goal: to get better and return home PT Goal Formulation: With patient Time For Goal Achievement: 10/28/23 Potential to Achieve Goals: Good    Frequency Min 2X/week     Co-evaluation               AM-PAC PT 6 Clicks Mobility  Outcome Measure Help needed turning from your back to your side while in a flat bed without using bedrails?: None Help needed moving from lying on your back to sitting on the side of a flat bed without using bedrails?: A Little Help needed moving to and from a bed to a chair (including a wheelchair)?: A Little Help needed standing up from a chair using your arms (e.g., wheelchair or bedside chair)?: A Little Help needed to walk in hospital room?: A Little Help needed climbing 3-5 steps with a railing? : A Lot 6 Click Score: 18    End of Session   Activity Tolerance: Patient tolerated treatment well Patient left: in chair;with call bell/phone within reach Nurse Communication: Mobility status PT Visit Diagnosis: Unsteadiness on feet (R26.81);Other abnormalities of gait and mobility (R26.89);Muscle weakness (generalized) (M62.81)    Time: 8951-8896 PT Time Calculation (min) (ACUTE ONLY): 15 min   Charges:                Gyanna Jarema Romero-Perozo, SPT  10/14/2023, 11:46 AM

## 2023-10-14 NOTE — Care Management Important Message (Signed)
 Important Message  Patient Details  Name: Henry Mcmahon MRN: [id number] Date of Birth: 12/20/1940   Important Message Given:  Yes - Medicare IM     Henry Mcmahon 10/14/2023, 1:00 PM

## 2023-10-14 NOTE — Progress Notes (Signed)
 Patient transported to CT and back to unit with this RN and RN [place]. Uneventful.

## 2023-10-14 NOTE — Progress Notes (Signed)
 [facility] CLINIC CARDIOLOGY PROGRESS NOTE       Patient ID: Henry Mcmahon MRN: [id number] DOB/AGE: 81-Nov-1942 81 y.o.  Admit date: 10/12/2023 Referring Physician Dr. Jens Primary Physician Borun, Marsa Hacker, MD Primary Cardiologist Dr. Dewane  Reason for Consultation Hematoma s/p ICD generator changeout  HPI: Henry Mcmahon is a 83 y.o. male  with a past medical history of CAD s/p 3v CABG in 2002 (LIMA to LAD, SVG to ramus, SVG to D1) and multiple stents, chronic HFmrEF s/p Biventricular ICD, hypertension, hyperlipidemia who presented for outpatient ICD generator change out on 10/12/2023 due to device at [facility] at pacer clinic.  Patient underwent ICD generator change out with Dr. Nolen s/p revealed significant hematoma, active bleeding and swelling. Of note patient was taking Plavix at home. In the specials recovery event, while holding pressure on incision site for about 10 minutes and patient suddenly became hypotensive, not responding to questions appropriately, not making eye contact.  Code stroke was called and patient started on IV fluids, BP improved and patient became more responsive and started to answer questions appropriately, code stroke canceled at this point.  Patient then transferred to stepdown.  Interval History: -Patient seen and examined this AM and laying comfortably in hospital bed. Patient states he feels fine and continues to deny chest pain, SOB, lightheadedness or any pain.  -Patient off pressors and BP improving. HR stable. Overnight Tele showed no significant events.  Patient is adequately pacing. -Patient appears euvolemic. -Patient remains on room air with stable SpO2.  -Placed clean dressing on left chest incision site. It appears stable, no active bleeding or significant tenderness. , Improvement in swelling. -Hemoglobin stable.  Review of systems complete and found to be negative unless listed above    Past Medical History:  Diagnosis Date    Cancer [facility])    prostate   CHF (congestive heart failure) (HCC)    Collagen vascular disease (HCC)    COPD (chronic obstructive pulmonary disease) (HCC)    Coronary artery disease    ETOH abuse    Gastric ulcer    H/O: UGI bleed    History of blood transfusion    HLD (hyperlipidemia)    Hypertension    Myocardial infarction (HCC)    VHD (valvular heart disease)     Past Surgical History:  Procedure Laterality Date   COLON SURGERY     COLONOSCOPY     CORONARY ARTERY BYPASS GRAFT     HEMORRHOID SURGERY     ICD GENERATOR CHANGEOUT N/A 10/12/2023   Procedure: ICD GENERATOR CHANGEOUT;  Surgeon: Ammon Marsa, MD;  Location: [facility] INVASIVE CV LAB;  Service: Cardiovascular;  Laterality: N/A;   INSERT / REPLACE / REMOVE PACEMAKER     LUMBAR DISC SURGERY     MICROLARYNGOSCOPY N/A 02/19/2016   Procedure: MICROLARYNGOSCOPY;  Surgeon: Deward Dolly, MD;  Location: [facility] ORS;  Service: ENT;  Laterality: N/A;   UPPER GASTROINTESTINAL ENDOSCOPY      Medications Prior to Admission  Medication Sig Dispense Refill Last Dose/Taking   atorvastatin  (LIPITOR) 40 MG tablet Take 40 mg by mouth at bedtime.   10/11/2023   cholecalciferol (VITAMIN D3) 25 MCG (1000 UNIT) tablet Take 1,000 Units by mouth daily.   10/12/2023 Morning   clopidogrel (PLAVIX) 75 MG tablet Take 75 mg by mouth daily.   10/12/2023 Morning   furosemide (LASIX) 20 MG tablet Take 20 mg by mouth daily.   10/12/2023 Morning   isosorbide mononitrate (IMDUR) 30 MG 24 hr tablet  30 mg daily.   10/12/2023 Morning   lisinopril (ZESTRIL) 10 MG tablet Take 10 mg by mouth daily.   10/12/2023 Morning   nitroGLYCERIN (NITROSTAT) 0.4 MG SL tablet Place 0.4 mg under the tongue every 5 (five) minutes as needed for chest pain.   Taking As Needed   pantoprazole  (PROTONIX ) 20 MG tablet Take 20 mg by mouth daily.   10/12/2023 Morning   tamsulosin (FLOMAX) 0.4 MG CAPS capsule Take 0.4 mg by mouth daily.   10/12/2023 Morning   vitamin B-12 (CYANOCOBALAMIN)  500 MCG tablet Take 500 mcg by mouth daily.   10/12/2023 Morning   Social History   Socioeconomic History   Marital status: Single    Spouse name: Not on file   Number of children: Not on file   Years of education: Not on file   Highest education level: Not on file  Occupational History   Not on file  Tobacco Use   Smoking status: Every Day    Current packs/day: 0.25    Types: Cigarettes   Smokeless tobacco: Never  Substance and Sexual Activity   Alcohol use: Yes    Alcohol/week: 36.0 standard drinks of alcohol    Types: 36 Cans of beer per week   Drug use: No   Sexual activity: Not on file  Other Topics Concern   Not on file  Social History Narrative   Not on file   Social Drivers of Health   Financial Resource Strain: Low Risk  (03/31/2023)   Received from [facility]   Overall Financial Resource Strain (CARDIA)    Difficulty of Paying Living Expenses: Not hard at all  Food Insecurity: Patient Declined (10/12/2023)   Hunger Vital Sign    Worried About Running Out of Food in the Last Year: Patient declined    Ran Out of Food in the Last Year: Patient declined  Transportation Needs: Patient Declined (10/12/2023)   PRAPARE - Administrator, Civil Service (Medical): Patient declined    Lack of Transportation (Non-Medical): Patient declined  Physical Activity: Not on file  Stress: Not on file  Social Connections: Patient Declined (10/12/2023)   Social Connection and Isolation Panel    Frequency of Communication with Friends and Family: Patient declined    Frequency of Social Gatherings with Friends and Family: Patient declined    Attends Religious Services: Patient declined    Database administrator or Organizations: Patient declined    Attends Banker Meetings: Patient declined    Marital Status: Patient declined  Intimate Partner Violence: Patient Declined (10/12/2023)   Humiliation, Afraid, Rape, and Kick questionnaire    Fear  of Current or Ex-Partner: Patient declined    Emotionally Abused: Patient declined    Physically Abused: Patient declined    Sexually Abused: Patient declined    History reviewed. No pertinent family history.   Vitals:   10/14/23 0900 10/14/23 1200 10/14/23 1300 10/14/23 1400  BP: (!) 102/45 (!) 102/46 (!) 113/53 (!) 109/57  Pulse: 74 70 80 70  Resp: 14 15 16 15   Temp:      TempSrc:      SpO2: 97% 93% (!) 87% 99%  Weight:      Height:        PHYSICAL EXAM General: Chronically ill appearing elderly male, well nourished, in no acute distress. HEENT: Normocephalic and atraumatic. Neck: No JVD.   Lungs: Normal respiratory effort on room air. Clear bilaterally to auscultation. No wheezes, crackles,  rhonchi.  Heart: HRRR. Normal S1 and S2 without gallops or murmurs.  Abdomen: Non-distended appearing.  Msk: Normal strength and tone for age. Extremities: Warm and well perfused. No clubbing, cyanosis, edema.  Neuro: Alert and oriented X 3. Psych: Answers questions appropriately.   Labs: Basic Metabolic Panel: Recent Labs    10/13/23 0351 10/14/23 0335  NA 133* 134*  K 4.3 4.1  CL 102 103  CO2 22 24  GLUCOSE 128* 79  BUN 15 11  CREATININE 0.71 0.58*  CALCIUM  8.8* 8.5*  MG  --  1.5*  PHOS  --  2.3*   Liver Function Tests: Recent Labs    10/12/23 1221  AST 23  ALT 16  ALKPHOS 44  BILITOT 1.3*  PROT 6.1*  ALBUMIN 3.6   No results for input(s): LIPASE, AMYLASE in the last 72 hours. CBC: Recent Labs    10/12/23 1221 10/12/23 1410 10/13/23 0351 10/14/23 0335  WBC 8.8   < > 8.1 8.2  NEUTROABS 6.0  --   --  5.9  HGB 11.8*   < > 10.6* 9.4*  HCT 34.5*   < > 31.2* 27.6*  MCV 90.1   < > 88.1 89.6  PLT 168   < > 163 141*   < > = values in this interval not displayed.   Cardiac Enzymes: Recent Labs    10/12/23 1221 10/12/23 1410  TROPONINIHS 9 12   BNP: No results for input(s): BNP in the last 72 hours. D-Dimer: No results for input(s): DDIMER in  the last 72 hours. Hemoglobin A1C: No results for input(s): HGBA1C in the last 72 hours. Fasting Lipid Panel: No results for input(s): CHOL, HDL, LDLCALC, TRIG, CHOLHDL, LDLDIRECT in the last 72 hours. Thyroid Function Tests: No results for input(s): TSH, T4TOTAL, T3FREE, THYROIDAB in the last 72 hours.  Invalid input(s): FREET3 Anemia Panel: Recent Labs    10/12/23 1410  FERRITIN 22*  TIBC 333  IRON 60     Radiology: ECHOCARDIOGRAM COMPLETE Result Date: 10/13/2023    ECHOCARDIOGRAM REPORT   Patient Name:   Henry Mcmahon Date of Exam: 10/12/2023 Medical Rec #:  [id number]      Height:       69.0 in Accession #:    [id number]     Weight:       138.0 lb Date of Birth:  12/01/1940       BSA:          1.765 m Patient Age:    83 years       BP:           101/62 mmHg Patient Gender: M              HR:           81 bpm. Exam Location:  [facility] Procedure: 2D Echo, Cardiac Doppler and Color Doppler (Both Spectral and Color            Flow Doppler were utilized during procedure). Indications:     Syncope R55                  Cardiomyopathy-unspecified I42.9  History:         Patient has no prior history of Echocardiogram examinations.                  CHF, Previous Myocardial Infarction, COPD; Risk                  Factors:Hypertension.  Sonographer:  Christopher Furnace Referring Phys:  [id number] AIDA CHO Diagnosing Phys: Marsa Dooms MD  Sonographer Comments: Technically difficult study due to poor echo windows, Technically challenging study due to limited acoustic windows, no apical window and no subcostal window. IMPRESSIONS  1. Left ventricular ejection fraction, by estimation, is 60 to 65%. The left ventricle has normal function. The left ventricle has no regional wall motion abnormalities. Left ventricular diastolic parameters were normal.  2. Right ventricular systolic function is normal. The right ventricular size is normal.  3. The mitral valve is normal in structure. Mild  mitral valve regurgitation. No evidence of mitral stenosis.  4. The aortic valve is normal in structure. Aortic valve regurgitation is not visualized. No aortic stenosis is present.  5. There is mild dilatation of the aortic root, measuring 43 mm.  6. The inferior vena cava is normal in size with greater than 50% respiratory variability, suggesting right atrial pressure of 3 mmHg. FINDINGS  Left Ventricle: Left ventricular ejection fraction, by estimation, is 60 to 65%. The left ventricle has normal function. The left ventricle has no regional wall motion abnormalities. Strain was performed and the global longitudinal strain is indeterminate. The left ventricular internal cavity size was normal in size. There is no left ventricular hypertrophy. Left ventricular diastolic parameters were normal. Right Ventricle: The right ventricular size is normal. No increase in right ventricular wall thickness. Right ventricular systolic function is normal. Left Atrium: Left atrial size was normal in size. Right Atrium: Right atrial size was normal in size. Pericardium: There is no evidence of pericardial effusion. Mitral Valve: The mitral valve is normal in structure. Mild mitral valve regurgitation. No evidence of mitral valve stenosis. Tricuspid Valve: The tricuspid valve is normal in structure. Tricuspid valve regurgitation is mild . No evidence of tricuspid stenosis. Aortic Valve: The aortic valve is normal in structure. Aortic valve regurgitation is not visualized. No aortic stenosis is present. Pulmonic Valve: The pulmonic valve was normal in structure. Pulmonic valve regurgitation is not visualized. No evidence of pulmonic stenosis. Aorta: The aortic root is normal in size and structure. There is mild dilatation of the aortic root, measuring 43 mm. Venous: The inferior vena cava is normal in size with greater than 50% respiratory variability, suggesting right atrial pressure of 3 mmHg. IAS/Shunts: No atrial level shunt  detected by color flow Doppler. Additional Comments: 3D was performed not requiring image post processing on an independent workstation and was indeterminate.  LEFT VENTRICLE PLAX 2D LVIDd:         3.30 cm LVIDs:         2.20 cm LV PW:         1.50 cm LV IVS:        1.60 cm LVOT diam:     2.30 cm LVOT Area:     4.15 cm  LEFT ATRIUM         Index LA diam:    3.80 cm 2.15 cm/m   AORTA Ao Root diam: 4.10 cm  SHUNTS Systemic Diam: 2.30 cm Marsa Dooms MD Electronically signed by Marsa Dooms MD Signature Date/Time: 10/13/2023/8:09:42 AM    Final    CT CHEST WO CONTRAST Result Date: 10/12/2023 CLINICAL DATA:  Provided history: Chest wall pain, nontraumatic, no prior imaging Hematoma post pacemaker generator change out, rule out hemothorax. EXAM: CT CHEST WITHOUT CONTRAST TECHNIQUE: Multidetector CT imaging of the chest was performed following the standard protocol without IV contrast. RADIATION DOSE REDUCTION: This exam was performed according to the  departmental dose-optimization program which includes automated exposure control, adjustment of the mA and/or kV according to patient size and/or use of iterative reconstruction technique. COMPARISON:  Chest radiograph 04/26/2012.  Chest CT 07/25/2009 FINDINGS: Cardiovascular: Left subclavian pacemaker with lead tips in the right atrium, right ventricle and coronary sinus, moderate associated streak artifact. No pericardial effusion. The heart is normal in size. Native coronary artery calcifications, post CABG. Aortic atherosclerosis. 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 Descending aorta is tortuous. No periaortic stranding. Mediastinum/Nodes: Small mediastinal lymph nodes are not enlarged by size criteria. Limited hilar assessment on this unenhanced exam. Patulous esophagus contains small amount of intraluminal fluid. Small hiatal hernia Lungs/Pleura: Moderate emphysema. No pneumothorax. No pleural fluid. Scattered areas of subsegmental atelectasis or  scarring. Subpleural reticulation within the anterior lungs, chronic and likely related to scarring. No features of pulmonary edema. No suspicious pulmonary nodule. Upper Abdomen: Symmetric perinephric edema is partially included. Gallstone. Musculoskeletal: Left-sided pacemaker battery pack with surrounding regional streak artifact. High-density fluid surrounding the battery pack is best evaluated inferiorly spanning 8.4 cm transverse, series 2, image 79. Subcutaneous gas within the collection is most prominent medially. Surrounding soft tissue edema extends inferiorly in the anterior chest wall, as well as superiorly into the supraclavicular soft tissues. Prior median sternotomy. Remote bilateral rib fractures. No acute osseous findings. IMPRESSION: 1. Left-sided pacemaker battery pack with surrounding high-density fluid, likely hematoma. Subcutaneous gas within the collection is most prominent medially. Surrounding soft tissue edema extends inferiorly in the anterior chest wall, as well as superiorly into the supraclavicular soft tissues. Sterility is technically indeterminate by imaging. 2. No pleural fluid, hemothorax or pneumothorax. 3. Emphysema. 4. Dilated ascending aorta at 4.1 cm. Recommend annual imaging followup by CTA or MRA. This recommendation follows 2010 ACCF/AHA/AATS/ACR/ASA/SCA/SCAI/SIR/STS/SVM Guidelines for the Diagnosis and Management of Patients with Thoracic Aortic Disease. Circulation. 2010; 121: Z733-z630. Aortic aneurysm NOS (ICD10-I71.9) Aortic Atherosclerosis (ICD10-I70.0) and Emphysema (ICD10-J43.9). Electronically Signed   By: Andrea Gasman M.D.   On: 10/12/2023 19:33   EP PPM/ICD IMPLANT Result Date: 10/12/2023 Successful BiV ICD generator change out (Medtronic Cobalt XT HF Quad)    ECHO as above.  TELEMETRY reviewed by me 10/14/2023: ventricular paced, rate 70s  EKG reviewed by me: ordered  Data reviewed by me 10/14/2023: last 24h vitals tele labs imaging I/O hospitalist  note, critical care notes.  Principal Problem:   ICD (implantable cardioverter-defibrillator) pocket hematoma Active Problems:   Hypotension   Syncope   Shock circulatory (HCC)   Protein-calorie malnutrition, severe    ASSESSMENT AND PLAN:  Henry Mcmahon is a 83 y.o. male  with a past medical history of CAD s/p 3v CABG in 2002 (LIMA to LAD, SVG to ramus, SVG to D1) and multiple stents, chronic HFmrEF s/p Biventricular ICD, hypertension, hyperlipidemia who presented for outpatient ICD generator change out on 10/12/2023 due to device at [facility] at pacer clinic.  Patient underwent ICD generator change out with Dr. Nolen s/p revealed significant hematoma, active bleeding and swelling. Of note patient was taking Plavix at home. In the specials recovery event, while holding pressure on incision site for about 10 minutes and patient suddenly became hypotensive, not responding to questions appropriately, not making eye contact.  Code stroke was called and patient started on IV fluids, BP improved and patient became more responsive and started to answer questions appropriately, code stroke canceled at this point.  Patient then transferred to stepdown.  # Shock, resolved # CAD s/p CABG x 3 and multiple  stents (last [facility] 2016) # s/p Biventricular ICD  # AMS s/p generator changeout (07/22) # Chronic HFmrEF Patients presents for outpatient ICD generator changeout, at incision site had complications with hematoma and swelling. Trops negative x2.  Lactic elevated at 2.3. Patient appears euvolemic. BP borderline without pressors, improved s/p IVF. Patient required pressors from 07/22 to 07/23. Off pressors since 07/23 and BP much improved.  Hemoglobin stable.  Echo this admission with preserved EF of 60-65%, no RWMA.  Per telemetry adequately ventricular paced. -Will continue to monitor left chest hematoma. Much improved, remains stable. -Monitor H&H -Hold Plavix. Do not resume at discharge, will be  discussed at outpatient cardiology follow-up. -Continue atorvastatin  40 mg daily. -Continue midodrine  10 mg 3 times daily.  Wean when able, Recommend MAP > 65.  -Home Imdur, lisinopril, lasix held as patient is on midodrine  for borderline BP.   This patient's plan of care was discussed and created with Dr. Ammon and he is in agreement.  Signed: Dorene Comfort, PA-C  10/14/2023, 3:34 PM [facility] Cardiology

## 2023-10-14 NOTE — Progress Notes (Signed)
 PROGRESS NOTE    Henry Mcmahon  FMW:[id number] DOB: 03/06/1941 DOA: 10/12/2023 PCP: Zackery Marsa Hacker, MD  No chief complaint on file.   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  Patient underwent ICD generator change but was found to be bleeding around the ICD.  Bleeding was thought to be excessive and thus patient was admitted to [facility] to monitor overnight.  Soon after admission bleeding worsened and patient became hypotensive, unresponsive.  Reportedly SBP during this admit was 72.  Patient received IV fluids. Code stroke was called but later canceled as it felt the patient's altered mentation was secondary to hypotension.  His mentation gradually improved.  He was admitted to the ICU.  Hemoglobin stabilized however patient was requiring Levophed  to maintain a MAP of 65 or higher.  On 7/24 [facility] assumed care of the patient from ICU.  Subjective: Overnight patient reportedly had another episode where he became unresponsive, had upward gaze, but then self resolved.  No reported postictal. On my evaluation this morning patient does require significant prompting to answer questions but does eventually demonstrate he is oriented x 4.  He is hard of hearing and appears to stare blankly at times.   Objective: Vitals:   10/14/23 0900 10/14/23 1200 10/14/23 1300 10/14/23 1400  BP: (!) 102/45 (!) 102/46 (!) 113/53 (!) 109/57  Pulse: 74 70 80 70  Resp: 14 15 16 15   Temp:      TempSrc:      SpO2: 97% 93% (!) 87% 99%  Weight:      Height:        Intake/Output Summary (Last 24 hours) at 10/14/2023 1712 Last data filed at 10/14/2023 1417 Gross per 24 hour  Intake 799.55 ml  Output 550 ml  Net 249.55 ml    Filed Weights   10/12/23 0651 10/12/23 1331 10/14/23 0404  Weight: 62.6 kg 60.4 kg 63.6 kg    Examination: General exam: Appears calm and comfortable, chronically ill-appearing, thin Respiratory system: No work of breathing, symmetric chest wall expansion Cardiovascular system: S1 & S2 heard, RRR.  Gastrointestinal system: Abdomen is nondistended, soft and nontender.  Neuro: Alert and oriented.  Hard of hearing.  Requires significant prompting to answer questions.  Requires some redirection.  Initially stated president was Franchot, year 2025 Extremities: Symmetric, expected ROM Skin: No rashes, lesions Psychiatry: mood and affect congruent  Assessment & Plan:  Principal Problem:   ICD (implantable cardioverter-defibrillator) pocket hematoma Active Problems:   Hypotension   Syncope   Shock circulatory (HCC)   Protein-calorie malnutrition, severe    Hemorrhagic shock - Secondary to left chest pocket hematoma following ICD generator change out.  Patient was on Plavix at time of generator change - This was likely also complicated by hypovolemia given patient's poor p.o. intake. - Initially required Levophed  in the ICU.  Improved now - Currently on midodrine , will taper as tolerated. - Continue on telemetry monitoring - Maintain MAP above 65  Acute blood loss anemia - Hemoglobin stable now - Continue trending CBC closely - Transfuse if hemoglobin less than 7  CAD status post CABG Biventricular ICD with generator change 10/12/2023 Chronic heart failure preserved EF Echocardiogram: EF 60 to 65%, mild mitral valve regurg. - CT chest negative for hemothorax, left-sided  pacemaker battery pack with surrounding high density fluid, likely hematoma - Cardiology still following, appreciate input  Leukocytosis - No obvious signs or symptoms of infection at this time.  Continue to monitor WBC and fever curve - Blood culture negative to date - UA ordered but still pending - Will  discontinue Zosyn  and follow fever curve  Episodic altered mentation - No witnessed events personally.  Per report episodes appear to resolve quickly and spontaneously without evidence of a postictal period.  Appear to be related to hypotension.  Could be revealing areas of prior damage with hypoperfusion though the patient has no notable history of CVA. - Continue to monitor closely on telemetry - Obtain head CT (MRI limited in house due to pacemaker.) - Code stroke earlier this admission was canceled as it was thought symptoms were secondary to hypotension. -Little utility in spot EEG.  If event occurs additional time consider EEG or transfer to [facility] for cEEG.  Will involve neurology if needed.  Hypomagnesemia Hypophosphatemia - Replace as needed  COPD - Currently stable on - Resume home meds  Alcohol abuse - 3-4 beers daily, has been counseled on cessation - Monitor closely for withdrawal.  Tobacco abuse -1/4 pack a day, has been counseled on cessation  Generalized weakness - Complicated by comorbidities, and advanced age.  Patient resides with 2 elderly sisters - Home health has been ordered at discharge  DVT prophylaxis: Hold AC. SCDs for now   Code Status: Full Code Disposition: Monitor overnight.  If stable in a.m. may be able to discharge with home health.  TOC aware.  This has been ordered  Consultants:    Procedures:  7/22 ICD generator change out  Antimicrobials:  Anti-infectives (From admission, onward)    Start     Dose/Rate Route Frequency Ordered Stop   10/12/23 2000  piperacillin -tazobactam (ZOSYN ) IVPB 3.375 g        3.375 g 12.5 mL/hr over 240 Minutes Intravenous Every 8 hours 10/12/23 1858     10/12/23 0833  gentamicin  (GARAMYCIN ) 80 mg in sodium chloride  0.9 % 500 mL irrigation  Status:  Discontinued          As needed 10/12/23 0833 10/12/23 1320   10/12/23 0645  ceFAZolin  (ANCEF ) IVPB 2g/100 mL premix        2 g 200 mL/hr over 30 Minutes  Intravenous On call 10/12/23 9367 10/12/23 0818   10/12/23 0015  gentamicin  (GARAMYCIN ) 80 mg in sodium chloride  0.9 % 500 mL irrigation  Status:  Discontinued        80 mg Irrigation On call 10/12/23 0004 10/12/23 1320   10/12/23 0000  cephALEXin  (KEFLEX ) 500 MG capsule        500 mg Oral 2 times daily 10/12/23 9092         Data Reviewed: I have personally reviewed following labs and imaging studies CBC: Recent Labs  Lab 10/12/23 1221 10/12/23 1410 10/12/23 1813 10/13/23 0351 10/14/23 0335  WBC 8.8  --  16.1* 8.1 8.2  NEUTROABS 6.0  --   --   --  5.9  HGB 11.8* 11.8* 11.5* 10.6* 9.4*  HCT 34.5* 34.7* 33.9* 31.2* 27.6*  MCV 90.1  --  91.6 88.1 89.6  PLT 168  --  173 163 141*   Basic Metabolic Panel: Recent Labs  Lab 10/12/23 1221 10/13/23 0351 10/14/23 0335  NA 131* 133* 134*  K 3.7 4.3 4.1  CL 100 102 103  CO2 20* 22 24  GLUCOSE 116*  128* 79  BUN 12 15 11   CREATININE 0.49* 0.71 0.58*  CALCIUM  8.7* 8.8* 8.5*  MG  --   --  1.5*  PHOS  --   --  2.3*   GFR: Estimated Creatinine Clearance: 65.1 mL/min (A) (by C-G formula based on SCr of 0.58 mg/dL (L)). Liver Function Tests: Recent Labs  Lab 10/12/23 1221  AST 23  ALT 16  ALKPHOS 44  BILITOT 1.3*  PROT 6.1*  ALBUMIN 3.6   CBG: Recent Labs  Lab 10/12/23 1325 10/14/23 0439  GLUCAP 120* 79    Recent Results (from the past 240 hours)  MRSA Next Gen by PCR, Nasal     Status: None   Collection Time: 10/12/23  1:33 PM   Specimen: Nasal Mucosa; Nasal Swab  Result Value Ref Range Status   MRSA by PCR Next Gen NOT DETECTED NOT DETECTED Final    Comment: (NOTE) The GeneXpert MRSA Assay (FDA approved for NASAL specimens only), is one component of a comprehensive MRSA colonization surveillance program. It is not intended to diagnose MRSA infection nor to guide or monitor treatment for MRSA infections. Test performance is not FDA approved in patients less than 69 years old. Performed at [facility],  [address] [place], [place] [address]   Culture, blood (Routine X 2) w Reflex to ID Panel     Status: None (Preliminary result)   Collection Time: 10/12/23  7:38 PM   Specimen: BLOOD  Result Value Ref Range Status   Specimen Description BLOOD BLOOD RIGHT HAND  Final   Special Requests   Final    BOTTLES DRAWN AEROBIC AND ANAEROBIC Blood Culture adequate volume   Culture   Final    NO GROWTH 2 DAYS Performed at [facility], [address] [place], [place] [address]    Report Status PENDING  Incomplete  Culture, blood (Routine X 2) w Reflex to ID Panel     Status: None (Preliminary result)   Collection Time: 10/12/23  9:24 PM   Specimen: BLOOD  Result Value Ref Range Status   Specimen Description BLOOD BLOOD RIGHT HAND  Final   Special Requests Blood Culture adequate volume  Final   Culture   Final    NO GROWTH 2 DAYS Performed at [facility], [address] [place], [place] [address]    Report Status PENDING  Incomplete     Radiology Studies: CT CHEST WO CONTRAST Result Date: 10/12/2023 CLINICAL DATA:  Provided history: Chest wall pain, nontraumatic, no prior imaging Hematoma post pacemaker generator change out, rule out hemothorax. EXAM: CT CHEST WITHOUT CONTRAST TECHNIQUE: Multidetector CT imaging of the chest was performed following the standard protocol without IV contrast. RADIATION DOSE REDUCTION: This exam was performed according to the departmental dose-optimization program which includes automated exposure control, adjustment of the mA and/or kV according to patient size and/or use of iterative reconstruction technique. COMPARISON:  Chest radiograph 04/26/2012.  Chest CT 07/25/2009 FINDINGS: Cardiovascular: Left subclavian pacemaker with lead tips in the right atrium, right ventricle and coronary sinus, moderate associated streak artifact. No pericardial effusion. The heart is normal in size. Native coronary artery calcifications, post CABG. Aortic  atherosclerosis. 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 Descending aorta is tortuous. No periaortic stranding. Mediastinum/Nodes: Small mediastinal lymph nodes are not enlarged by size criteria. Limited hilar assessment on this unenhanced exam. Patulous esophagus contains small amount of intraluminal fluid. Small hiatal hernia Lungs/Pleura: Moderate emphysema. No pneumothorax. No pleural fluid. Scattered areas  of subsegmental atelectasis or scarring. Subpleural reticulation within the anterior lungs, chronic and likely related to scarring. No features of pulmonary edema. No suspicious pulmonary nodule. Upper Abdomen: Symmetric perinephric edema is partially included. Gallstone. Musculoskeletal: Left-sided pacemaker battery pack with surrounding regional streak artifact. High-density fluid surrounding the battery pack is best evaluated inferiorly spanning 8.4 cm transverse, series 2, image 79. Subcutaneous gas within the collection is most prominent medially. Surrounding soft tissue edema extends inferiorly in the anterior chest wall, as well as superiorly into the supraclavicular soft tissues. Prior median sternotomy. Remote bilateral rib fractures. No acute osseous findings. IMPRESSION: 1. Left-sided pacemaker battery pack with surrounding high-density fluid, likely hematoma. Subcutaneous gas within the collection is most prominent medially. Surrounding soft tissue edema extends inferiorly in the anterior chest wall, as well as superiorly into the supraclavicular soft tissues. Sterility is technically indeterminate by imaging. 2. No pleural fluid, hemothorax or pneumothorax. 3. Emphysema. 4. Dilated ascending aorta at 4.1 cm. Recommend annual imaging followup by CTA or MRA. This recommendation follows 2010 ACCF/AHA/AATS/ACR/ASA/SCA/SCAI/SIR/STS/SVM Guidelines for the Diagnosis and Management of Patients with Thoracic Aortic Disease. Circulation. 2010; 121: Z733-z630. Aortic aneurysm NOS (ICD10-I71.9) Aortic  Atherosclerosis (ICD10-I70.0) and Emphysema (ICD10-J43.9). Electronically Signed   By: Andrea Gasman M.D.   On: 10/12/2023 19:33    Scheduled Meds:  atorvastatin   40 mg Oral QHS   Chlorhexidine  Gluconate Cloth  6 each Topical Q0600   feeding supplement  237 mL Oral TID BM   folic acid   1 mg Oral Daily   midodrine   10 mg Oral TID WC   multivitamin with minerals  1 tablet Oral Daily   pantoprazole   20 mg Oral Daily   thiamine   100 mg Oral Daily   Continuous Infusions:  piperacillin -tazobactam (ZOSYN )  IV 3.375 g (10/14/23 1438)     LOS: 2 days  MDM: Patient is high risk for one or more organ failure.  They necessitate ongoing hospitalization for continued IV therapies and subsequent lab monitoring. Total time spent interpreting labs and vitals, reviewing the medical record, coordinating care amongst consultants and care team members, directly assessing and discussing care with the patient and/or family: 55 min  Meghana Tullo, DO [facility]  To contact the attending physician between 7A-7P please use Epic Chat. To contact the covering physician during after hours 7P-7A, please review Amion.  10/14/2023, 5:12 PM   *This document has been created with the assistance of dictation software. Please excuse typographical errors. *

## 2023-10-14 NOTE — TOC Progression Note (Signed)
 Transition of Care [facility]) - Progression Note    Patient Details  Name: Henry Mcmahon MRN: [id number] Date of Birth: 03-Sep-1940  Transition of Care [facility] CM/SW Contact  [place] L Henry Mcmahon, Henry Mcmahon Phone Number: 10/14/2023, 3:27 PM  Clinical Narrative:     Patient potentially ready for discharge tomorrow. CSW spoke with Henry Mcmahon, sister regarding [facility] choice. [name] advised that she has no preference and gave verbal consent for CSW to identify an agency to provide PT/RN services.   Based on patient insurance, CSW contacted [facility] and spoke with Henry Mcmahon. Home Health services are in place and will follow-up with patient once discharged.    Barriers to Discharge: No Barriers Identified               Expected Discharge Plan and Services         Expected Discharge Date: 10/12/23                         HH Arranged: PT, RN HH Agency: [facility] Date [facility] Agency Contacted: 10/14/23 Time HH Agency Contacted: 1340 Representative spoke with at [facility] Agency: [place]   Social Drivers of Health (SDOH) Interventions SDOH Screenings   Food Insecurity: Patient Declined (10/12/2023)  Housing: Patient Declined (10/12/2023)  Transportation Needs: Patient Declined (10/12/2023)  Utilities: Patient Declined (10/12/2023)  Financial Resource Strain: Low Risk  (03/31/2023)   Received from [facility]  Social Connections: Patient Declined (10/12/2023)  Tobacco Use: High Risk (10/12/2023)    Readmission Risk Interventions     No data to display

## 2023-10-14 NOTE — Progress Notes (Signed)
 Pt became minimally responsive this morning around 0315, eyes open glazed and fixed. staring out and not responding to voice. Sternal rub preformed and pt groaned, started moving eyes side to side after and still not speaking clearly. BP remain stable at 101/51 (68). HR remained ventricular paced at 77 and O2 Stats were 93%, after a couple of mins of stimulating pt, he was able to follow simple commands and stated something felt wrong. Paged NP intensivist coverage and explained situation, pt had previously had similar episode and code stroke was called then cancelled, head ct was preformed. For this occurrence no new orders were provided, awaiting lab for this morning to be drawn. Will continue to update NP on any changes.

## 2023-10-15 DIAGNOSIS — T82837A Hemorrhage of cardiac prosthetic devices, implants and grafts, initial encounter: Secondary | ICD-10-CM | POA: Diagnosis not present

## 2023-10-15 DIAGNOSIS — I959 Hypotension, unspecified: Secondary | ICD-10-CM | POA: Diagnosis not present

## 2023-10-15 DIAGNOSIS — E43 Unspecified severe protein-calorie malnutrition: Secondary | ICD-10-CM | POA: Diagnosis not present

## 2023-10-15 DIAGNOSIS — R55 Syncope and collapse: Secondary | ICD-10-CM | POA: Diagnosis not present

## 2023-10-15 LAB — CBC
HCT: 25.3 % — ABNORMAL LOW (ref 39.0–52.0)
Hemoglobin: 8.7 g/dL — ABNORMAL LOW (ref 13.0–17.0)
MCH: 30.7 pg (ref 26.0–34.0)
MCHC: 34.4 g/dL (ref 30.0–36.0)
MCV: 89.4 fL (ref 80.0–100.0)
Platelets: 148 K/uL — ABNORMAL LOW (ref 150–400)
RBC: 2.83 MIL/uL — ABNORMAL LOW (ref 4.22–5.81)
RDW: 15 % (ref 11.5–15.5)
WBC: 6.8 K/uL (ref 4.0–10.5)
nRBC: 0 % (ref 0.0–0.2)

## 2023-10-15 LAB — MAGNESIUM: Magnesium: 2.3 mg/dL (ref 1.7–2.4)

## 2023-10-15 LAB — PHOSPHORUS: Phosphorus: 3.5 mg/dL (ref 2.5–4.6)

## 2023-10-15 MED ORDER — POLYETHYLENE GLYCOL 3350 17 G PO PACK: 17.0000 g | PACK | Freq: Every day | ORAL | Status: DC | PRN

## 2023-10-15 MED ORDER — DOCUSATE SODIUM 100 MG PO CAPS: 100.0000 mg | ORAL_CAPSULE | Freq: Two times a day (BID) | ORAL | Status: DC | PRN

## 2023-10-15 MED ORDER — MIDODRINE HCL 5 MG PO TABS
5.0000 mg | ORAL_TABLET | Freq: Three times a day (TID) | ORAL | Status: DC
  Administered 2023-10-15 – 2023-10-16 (×2): 5 mg via ORAL
  Filled 2023-10-15 (×2): qty 1

## 2023-10-15 NOTE — Plan of Care (Signed)
 Continuing with plan of care.

## 2023-10-15 NOTE — Progress Notes (Signed)
 [facility] CLINIC CARDIOLOGY PROGRESS NOTE       Patient ID: Henry Mcmahon MRN: [id number] DOB/AGE: 81-01-1941 81 y.o.  Admit date: 10/12/2023 Referring Physician Dr. Jens Primary Physician Borun, Marsa Hacker, MD Primary Cardiologist Dr. Dewane  Reason for Consultation Hematoma s/p ICD generator changeout  HPI: Henry Mcmahon is a 83 y.o. male  with a past medical history of CAD s/p 3v CABG in 2002 (LIMA to LAD, SVG to ramus, SVG to D1) and multiple stents, chronic HFmrEF s/p Biventricular ICD, hypertension, hyperlipidemia who presented for outpatient ICD generator change out on 10/12/2023 due to device at [facility] at pacer clinic.  Patient underwent ICD generator change out with Dr. Nolen s/p revealed significant hematoma, active bleeding and swelling. Of note patient was taking Plavix at home. In the specials recovery event, while holding pressure on incision site for about 10 minutes and patient suddenly became hypotensive, not responding to questions appropriately, not making eye contact.  Code stroke was called and patient started on IV fluids, BP improved and patient became more responsive and started to answer questions appropriately, code stroke canceled at this point.  Patient then transferred to stepdown.  Interval History: -Patient seen and examined this AM and laying comfortably in hospital bed. Patient states he feels fine and continues to deny chest pain, SOB, lightheadedness or any pain.  -Patient BP improved and stable. HR stable. Overnight Tele showed no significant events.  Patient is adequately pacing. -Patient appears euvolemic. -Patient remains on room air with stable SpO2.  -Left chest incision site continues to appear stable, no active bleeding or significant tenderness. Swelling appears stable.  -Hemoglobin 11.8 > 11.5 > 10.6 > 9.4 > 8.7.  Review of systems complete and found to be negative unless listed above    Past Medical History:  Diagnosis Date    Cancer [facility])    prostate   CHF (congestive heart failure) (HCC)    Collagen vascular disease (HCC)    COPD (chronic obstructive pulmonary disease) (HCC)    Coronary artery disease    ETOH abuse    Gastric ulcer    H/O: UGI bleed    History of blood transfusion    HLD (hyperlipidemia)    Hypertension    Myocardial infarction (HCC)    VHD (valvular heart disease)     Past Surgical History:  Procedure Laterality Date   COLON SURGERY     COLONOSCOPY     CORONARY ARTERY BYPASS GRAFT     HEMORRHOID SURGERY     ICD GENERATOR CHANGEOUT N/A 10/12/2023   Procedure: ICD GENERATOR CHANGEOUT;  Surgeon: Ammon Marsa, MD;  Location: [facility] INVASIVE CV LAB;  Service: Cardiovascular;  Laterality: N/A;   INSERT / REPLACE / REMOVE PACEMAKER     LUMBAR DISC SURGERY     MICROLARYNGOSCOPY N/A 02/19/2016   Procedure: MICROLARYNGOSCOPY;  Surgeon: Deward Dolly, MD;  Location: [facility] ORS;  Service: ENT;  Laterality: N/A;   UPPER GASTROINTESTINAL ENDOSCOPY      Medications Prior to Admission  Medication Sig Dispense Refill Last Dose/Taking   atorvastatin  (LIPITOR) 40 MG tablet Take 40 mg by mouth at bedtime.   10/11/2023   cholecalciferol (VITAMIN D3) 25 MCG (1000 UNIT) tablet Take 1,000 Units by mouth daily.   10/12/2023 Morning   clopidogrel (PLAVIX) 75 MG tablet Take 75 mg by mouth daily.   10/12/2023 Morning   furosemide (LASIX) 20 MG tablet Take 20 mg by mouth daily.   10/12/2023 Morning   isosorbide mononitrate (IMDUR) 30  MG 24 hr tablet 30 mg daily.   10/12/2023 Morning   lisinopril (ZESTRIL) 10 MG tablet Take 10 mg by mouth daily.   10/12/2023 Morning   nitroGLYCERIN (NITROSTAT) 0.4 MG SL tablet Place 0.4 mg under the tongue every 5 (five) minutes as needed for chest pain.   Taking As Needed   pantoprazole  (PROTONIX ) 20 MG tablet Take 20 mg by mouth daily.   10/12/2023 Morning   tamsulosin (FLOMAX) 0.4 MG CAPS capsule Take 0.4 mg by mouth daily.   10/12/2023 Morning   vitamin B-12 (CYANOCOBALAMIN)  500 MCG tablet Take 500 mcg by mouth daily.   10/12/2023 Morning   Social History   Socioeconomic History   Marital status: Single    Spouse name: Not on file   Number of children: Not on file   Years of education: Not on file   Highest education level: Not on file  Occupational History   Not on file  Tobacco Use   Smoking status: Every Day    Current packs/day: 0.25    Types: Cigarettes   Smokeless tobacco: Never  Substance and Sexual Activity   Alcohol use: Yes    Alcohol/week: 36.0 standard drinks of alcohol    Types: 36 Cans of beer per week   Drug use: No   Sexual activity: Not on file  Other Topics Concern   Not on file  Social History Narrative   Not on file   Social Drivers of Health   Financial Resource Strain: Low Risk  (03/31/2023)   Received from [facility]   Overall Financial Resource Strain (CARDIA)    Difficulty of Paying Living Expenses: Not hard at all  Food Insecurity: Patient Declined (10/12/2023)   Hunger Vital Sign    Worried About Running Out of Food in the Last Year: Patient declined    Ran Out of Food in the Last Year: Patient declined  Transportation Needs: Patient Declined (10/12/2023)   PRAPARE - Administrator, Civil Service (Medical): Patient declined    Lack of Transportation (Non-Medical): Patient declined  Physical Activity: Not on file  Stress: Not on file  Social Connections: Patient Declined (10/12/2023)   Social Connection and Isolation Panel    Frequency of Communication with Friends and Family: Patient declined    Frequency of Social Gatherings with Friends and Family: Patient declined    Attends Religious Services: Patient declined    Database administrator or Organizations: Patient declined    Attends Banker Meetings: Patient declined    Marital Status: Patient declined  Intimate Partner Violence: Patient Declined (10/12/2023)   Humiliation, Afraid, Rape, and Kick questionnaire    Fear  of Current or Ex-Partner: Patient declined    Emotionally Abused: Patient declined    Physically Abused: Patient declined    Sexually Abused: Patient declined    History reviewed. No pertinent family history.   Vitals:   10/15/23 0900 10/15/23 1000 10/15/23 1033 10/15/23 1100  BP: 127/65 (!) 118/51  (!) 123/52  Pulse: 66 83 81 82  Resp: 13 16 17 17   Temp:      TempSrc:      SpO2: 100% 100% 100% 95%  Weight:      Height:        PHYSICAL EXAM General: Chronically ill appearing elderly male, well nourished, in no acute distress. HEENT: Normocephalic and atraumatic. Neck: No JVD.   Lungs: Normal respiratory effort on room air. Clear bilaterally to auscultation. No wheezes,  crackles, rhonchi.  Heart: HRRR. Normal S1 and S2 without gallops or murmurs.  Abdomen: Non-distended appearing.  Msk: Normal strength and tone for age. Extremities: Warm and well perfused. No clubbing, cyanosis, edema.  Neuro: Alert and oriented X 3. Psych: Answers questions appropriately.   Labs: Basic Metabolic Panel: Recent Labs    10/13/23 0351 10/14/23 0335 10/15/23 0309  NA 133* 134*  --   K 4.3 4.1  --   CL 102 103  --   CO2 22 24  --   GLUCOSE 128* 79  --   BUN 15 11  --   CREATININE 0.71 0.58*  --   CALCIUM  8.8* 8.5*  --   MG  --  1.5* 2.3  PHOS  --  2.3* 3.5   Liver Function Tests: Recent Labs    10/12/23 1221  AST 23  ALT 16  ALKPHOS 44  BILITOT 1.3*  PROT 6.1*  ALBUMIN 3.6   No results for input(s): LIPASE, AMYLASE in the last 72 hours. CBC: Recent Labs    10/12/23 1221 10/12/23 1410 10/14/23 0335 10/15/23 0309  WBC 8.8   < > 8.2 6.8  NEUTROABS 6.0  --  5.9  --   HGB 11.8*   < > 9.4* 8.7*  HCT 34.5*   < > 27.6* 25.3*  MCV 90.1   < > 89.6 89.4  PLT 168   < > 141* 148*   < > = values in this interval not displayed.   Cardiac Enzymes: Recent Labs    10/12/23 1221 10/12/23 1410  TROPONINIHS 9 12   BNP: No results for input(s): BNP in the last 72  hours. D-Dimer: No results for input(s): DDIMER in the last 72 hours. Hemoglobin A1C: No results for input(s): HGBA1C in the last 72 hours. Fasting Lipid Panel: No results for input(s): CHOL, HDL, LDLCALC, TRIG, CHOLHDL, LDLDIRECT in the last 72 hours. Thyroid Function Tests: No results for input(s): TSH, T4TOTAL, T3FREE, THYROIDAB in the last 72 hours.  Invalid input(s): FREET3 Anemia Panel: Recent Labs    10/12/23 1410  FERRITIN 22*  TIBC 333  IRON 60     Radiology: CT HEAD WO CONTRAST ( ) Result Date: 10/14/2023 CLINICAL DATA:  Mental status change, persistent or worsening intermittent altered mentation, worse with hypotension. EXAM: CT HEAD WITHOUT CONTRAST TECHNIQUE: Contiguous axial images were obtained from the base of the skull through the vertex without intravenous contrast. RADIATION DOSE REDUCTION: This exam was performed according to the departmental dose-optimization program which includes automated exposure control, adjustment of the mA and/or kV according to patient size and/or use of iterative reconstruction technique. COMPARISON:  CT head 10/15/2009. FINDINGS: Brain: No evidence of acute infarction, hemorrhage, hydrocephalus, extra-axial collection or mass lesion/mass effect. Moderate to severe patchy and confluent white matter hypodensities, nonspecific but compatible with chronic microvascular ischemic change. Left anterior frontal encephalomalacia. Vascular: No hyperdense vessel.  Calcific atherosclerosis. Skull: No acute fracture. Sinuses/Orbits: Left maxillary sinus air-fluid level and partial opacification. No acute orbital findings. Other: No mastoid effusions. IMPRESSION: 1. No evidence of acute intracranial abnormality. 2. Left anterior frontal encephalomalacia. 3. Moderate to severe chronic microvascular ischemic change. 4. Left maxillary sinus air-fluid level and partial opacification. Electronically Signed   By: Gilmore GORMAN Molt M.D.    On: 10/14/2023 20:32   ECHOCARDIOGRAM COMPLETE Result Date: 10/13/2023    ECHOCARDIOGRAM REPORT   Patient Name:   Henry Mcmahon Date of Exam: 10/12/2023 Medical Rec #:  [id number]      Height:  69.0 in Accession #:    [id number]     Weight:       138.0 lb Date of Birth:  May 12, 1940       BSA:          1.765 m Patient Age:    83 years       BP:           101/62 mmHg Patient Gender: M              HR:           81 bpm. Exam Location:  [facility] Procedure: 2D Echo, Cardiac Doppler and Color Doppler (Both Spectral and Color            Flow Doppler were utilized during procedure). Indications:     Syncope R55                  Cardiomyopathy-unspecified I42.9  History:         Patient has no prior history of Echocardiogram examinations.                  CHF, Previous Myocardial Infarction, COPD; Risk                  Factors:Hypertension.  Sonographer:     Christopher Furnace Referring Phys:  [id number] AIDA CHO Diagnosing Phys: Marsa Dooms MD  Sonographer Comments: Technically difficult study due to poor echo windows, Technically challenging study due to limited acoustic windows, no apical window and no subcostal window. IMPRESSIONS  1. Left ventricular ejection fraction, by estimation, is 60 to 65%. The left ventricle has normal function. The left ventricle has no regional wall motion abnormalities. Left ventricular diastolic parameters were normal.  2. Right ventricular systolic function is normal. The right ventricular size is normal.  3. The mitral valve is normal in structure. Mild mitral valve regurgitation. No evidence of mitral stenosis.  4. The aortic valve is normal in structure. Aortic valve regurgitation is not visualized. No aortic stenosis is present.  5. There is mild dilatation of the aortic root, measuring 43 mm.  6. The inferior vena cava is normal in size with greater than 50% respiratory variability, suggesting right atrial pressure of 3 mmHg. FINDINGS  Left Ventricle: Left ventricular ejection  fraction, by estimation, is 60 to 65%. The left ventricle has normal function. The left ventricle has no regional wall motion abnormalities. Strain was performed and the global longitudinal strain is indeterminate. The left ventricular internal cavity size was normal in size. There is no left ventricular hypertrophy. Left ventricular diastolic parameters were normal. Right Ventricle: The right ventricular size is normal. No increase in right ventricular wall thickness. Right ventricular systolic function is normal. Left Atrium: Left atrial size was normal in size. Right Atrium: Right atrial size was normal in size. Pericardium: There is no evidence of pericardial effusion. Mitral Valve: The mitral valve is normal in structure. Mild mitral valve regurgitation. No evidence of mitral valve stenosis. Tricuspid Valve: The tricuspid valve is normal in structure. Tricuspid valve regurgitation is mild . No evidence of tricuspid stenosis. Aortic Valve: The aortic valve is normal in structure. Aortic valve regurgitation is not visualized. No aortic stenosis is present. Pulmonic Valve: The pulmonic valve was normal in structure. Pulmonic valve regurgitation is not visualized. No evidence of pulmonic stenosis. Aorta: The aortic root is normal in size and structure. There is mild dilatation of the aortic root, measuring 43 mm. Venous: The inferior vena cava is normal  in size with greater than 50% respiratory variability, suggesting right atrial pressure of 3 mmHg. IAS/Shunts: No atrial level shunt detected by color flow Doppler. Additional Comments: 3D was performed not requiring image post processing on an independent workstation and was indeterminate.  LEFT VENTRICLE PLAX 2D LVIDd:         3.30 cm LVIDs:         2.20 cm LV PW:         1.50 cm LV IVS:        1.60 cm LVOT diam:     2.30 cm LVOT Area:     4.15 cm  LEFT ATRIUM         Index LA diam:    3.80 cm 2.15 cm/m   AORTA Ao Root diam: 4.10 cm  SHUNTS Systemic Diam: 2.30  cm Marsa Dooms MD Electronically signed by Marsa Dooms MD Signature Date/Time: 10/13/2023/8:09:42 AM    Final    CT CHEST WO CONTRAST Result Date: 10/12/2023 CLINICAL DATA:  Provided history: Chest wall pain, nontraumatic, no prior imaging Hematoma post pacemaker generator change out, rule out hemothorax. EXAM: CT CHEST WITHOUT CONTRAST TECHNIQUE: Multidetector CT imaging of the chest was performed following the standard protocol without IV contrast. RADIATION DOSE REDUCTION: This exam was performed according to the departmental dose-optimization program which includes automated exposure control, adjustment of the mA and/or kV according to patient size and/or use of iterative reconstruction technique. COMPARISON:  Chest radiograph 04/26/2012.  Chest CT 07/25/2009 FINDINGS: Cardiovascular: Left subclavian pacemaker with lead tips in the right atrium, right ventricle and coronary sinus, moderate associated streak artifact. No pericardial effusion. The heart is normal in size. Native coronary artery calcifications, post CABG. Aortic atherosclerosis. 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 Descending aorta is tortuous. No periaortic stranding. Mediastinum/Nodes: Small mediastinal lymph nodes are not enlarged by size criteria. Limited hilar assessment on this unenhanced exam. Patulous esophagus contains small amount of intraluminal fluid. Small hiatal hernia Lungs/Pleura: Moderate emphysema. No pneumothorax. No pleural fluid. Scattered areas of subsegmental atelectasis or scarring. Subpleural reticulation within the anterior lungs, chronic and likely related to scarring. No features of pulmonary edema. No suspicious pulmonary nodule. Upper Abdomen: Symmetric perinephric edema is partially included. Gallstone. Musculoskeletal: Left-sided pacemaker battery pack with surrounding regional streak artifact. High-density fluid surrounding the battery pack is best evaluated inferiorly spanning 8.4 cm transverse,  series 2, image 79. Subcutaneous gas within the collection is most prominent medially. Surrounding soft tissue edema extends inferiorly in the anterior chest wall, as well as superiorly into the supraclavicular soft tissues. Prior median sternotomy. Remote bilateral rib fractures. No acute osseous findings. IMPRESSION: 1. Left-sided pacemaker battery pack with surrounding high-density fluid, likely hematoma. Subcutaneous gas within the collection is most prominent medially. Surrounding soft tissue edema extends inferiorly in the anterior chest wall, as well as superiorly into the supraclavicular soft tissues. Sterility is technically indeterminate by imaging. 2. No pleural fluid, hemothorax or pneumothorax. 3. Emphysema. 4. Dilated ascending aorta at 4.1 cm. Recommend annual imaging followup by CTA or MRA. This recommendation follows 2010 ACCF/AHA/AATS/ACR/ASA/SCA/SCAI/SIR/STS/SVM Guidelines for the Diagnosis and Management of Patients with Thoracic Aortic Disease. Circulation. 2010; 121: Z733-z630. Aortic aneurysm NOS (ICD10-I71.9) Aortic Atherosclerosis (ICD10-I70.0) and Emphysema (ICD10-J43.9). Electronically Signed   By: Andrea Gasman M.D.   On: 10/12/2023 19:33   EP PPM/ICD IMPLANT Result Date: 10/12/2023 Successful BiV ICD generator change out (Medtronic Cobalt XT HF Quad)    ECHO as above.  TELEMETRY reviewed by me 10/15/2023: ventricular paced,  rate 70s  EKG reviewed by me: ordered  Data reviewed by me 10/15/2023: last 24h vitals tele labs imaging I/O hospitalist note, critical care notes.  Principal Problem:   ICD (implantable cardioverter-defibrillator) pocket hematoma Active Problems:   Hypotension   Syncope   Shock circulatory (HCC)   Protein-calorie malnutrition, severe    ASSESSMENT AND PLAN:  Henry Mcmahon is a 83 y.o. male  with a past medical history of CAD s/p 3v CABG in 2002 (LIMA to LAD, SVG to ramus, SVG to D1) and multiple stents, chronic HFmrEF s/p Biventricular ICD,  hypertension, hyperlipidemia who presented for outpatient ICD generator change out on 10/12/2023 due to device at [facility] at pacer clinic.  Patient underwent ICD generator change out with Dr. Nolen s/p revealed significant hematoma, active bleeding and swelling. Of note patient was taking Plavix at home. In the specials recovery event, while holding pressure on incision site for about 10 minutes and patient suddenly became hypotensive, not responding to questions appropriately, not making eye contact.  Code stroke was called and patient started on IV fluids, BP improved and patient became more responsive and started to answer questions appropriately, code stroke canceled at this point.  Patient then transferred to stepdown.  # Shock, resolved # CAD s/p CABG x 3 and multiple stents (last [facility] 2016) # s/p Biventricular ICD  # AMS s/p generator changeout (07/22) # Chronic HFmrEF Patients presents for outpatient ICD generator changeout, at incision site had complications with hematoma and swelling. Trops negative x2.  Lactic elevated at 2.3. Patient appears euvolemic. BP borderline without pressors, improved s/p IVF. Patient required pressors from 07/22 to 07/23. Off pressors since 07/23 and BP much improved.  Hemoglobin stable.  Echo this admission with preserved EF of 60-65%, no RWMA.  Per telemetry adequately ventricular paced. -Will continue to monitor left chest hematoma. Much improved, remains stable. -Continue to monitor H&H -Hold Plavix. Do not resume at discharge, will be discussed at outpatient cardiology follow-up. -Continue atorvastatin  40 mg daily. -Weaned midodrine  to 5 mg 3 times daily. Continue to wean when able, Recommend MAP > 65.  -Home Imdur, lisinopril, lasix held as patient is on midodrine  for borderline BP.  -Recommend patient be transferred to floor.   This patient's plan of care was discussed and created with Dr. Ammon and he is in agreement.  Signed: Dorene Comfort,  PA-C  10/15/2023, 12:02 PM [facility] Cardiology

## 2023-10-15 NOTE — Plan of Care (Signed)
  Problem: Education: Goal: Knowledge of cardiac device and self-care will improve Outcome: Progressing   Problem: Health Behavior/Discharge Planning: Goal: Ability to manage health-related needs will improve Outcome: Progressing   Problem: Clinical Measurements: Goal: Respiratory complications will improve Outcome: Progressing   Problem: Clinical Measurements: Goal: Cardiovascular complication will be avoided Outcome: Progressing   Problem: Activity: Goal: Risk for activity intolerance will decrease Outcome: Progressing

## 2023-10-15 NOTE — Progress Notes (Signed)
 Physical Therapy Treatment Patient Details Name: Henry Mcmahon MRN: [id number] DOB: [birth year] Today's Date: 10/15/2023   History of Present Illness Henry Mcmahon is a 83 y.o. male with medical history significant for cardiomyopathy s/p biventricular ICD, hypertension, hyperlipidemia, CAD, COPD, alcohol use disorder, gastric ulcer, history of upper GI bleeding, prostate cancer, who presented to the Cath Lab for ICD change out.    PT Comments  Pt was long sitting in bed upon arrival. He is alert but needs max encouragement to participate. Per pt's sister, not unusual for him to be short/easy agitated. He eventually agrees to OOB activity. Confirmed pt is close to his baseline abilities. Sister reports he has not ambulated very far in a long time. Pt was able to ambulate 20 ft without AD with some unsteadiness noted. Unwilling to use sling for LUE or use an AD on RUE to improve balance. Author encouraged pt to consider using SPC at DC.   I'll think about it.   If plan is discharge home, recommend the following: A little help with walking and/or transfers;A little help with bathing/dressing/bathroom;Assistance with cooking/housework;Direct supervision/assist for medications management;Assist for transportation;Help with stairs or ramp for entrance     Equipment Recommendations  SPC      Precautions / Restrictions Precautions Precautions: Fall Recall of Precautions/Restrictions: Impaired Precaution/Restrictions Comments: L UE limited movement Required Braces or Orthoses: Sling (pt was unwilling to wear) Restrictions Weight Bearing Restrictions Per Provider Order: No     Mobility  Bed Mobility Overal bed mobility: Needs Assistance Bed Mobility: Supine to Sit, Sit to Supine  Supine to sit: Contact guard, Supervision Sit to supine: Contact guard assist General bed mobility comments: CGA due to impulsivity    Transfers Overall transfer level: Needs assistance Equipment used:  None Transfers: Sit to/from Stand Sit to Stand: Contact guard assist  General transfer comment: CGA for safety    Ambulation/Gait Ambulation/Gait assistance: Contact guard assist, Min assist Gait Distance (Feet): 20 Feet Assistive device: None Gait Pattern/deviations: Step-through pattern, Decreased step length - right, Decreased step length - left, Narrow base of support Gait velocity: fast/impulsive  General Gait Details: Pt ambulated ~ 20 ft without AD. Pt remains a high fall risk due to his impulsivity    Balance Overall balance assessment: Needs assistance Sitting-balance support: Single extremity supported, Feet supported Sitting balance-Leahy Scale: Fair     Standing balance support: No upper extremity supported Standing balance-Leahy Scale: Fair       Hotel manager: Impaired Factors Affecting Communication: Hearing impaired  Cognition Arousal: Alert Behavior During Therapy: Agitated, Impulsive, Flat affect      PT - Cognition Comments: per discussion with pt's sister, pt seems close to baseline cognition. easily gets agitated but did participate in limited session . HR levated form 66 to 136 with only ambulating from EOB to doorway of room. Following commands: Impaired Following commands impaired: Follows one step commands inconsistently, Follows multi-step commands inconsistently    Cueing Cueing Techniques: Verbal cues, Tactile cues         Pertinent Vitals/Pain Pain Assessment Pain Assessment: No/denies pain     PT Goals (current goals can now be found in the care plan section) Acute Rehab PT Goals Patient Stated Goal: go home Progress towards PT goals: Progressing toward goals    Frequency    Min 2X/week       AM-PAC PT 6 Clicks Mobility   Outcome Measure  Help needed turning from your back to your side while in a  flat bed without using bedrails?: None Help needed moving from lying on your back to sitting on the  side of a flat bed without using bedrails?: A Little Help needed moving to and from a bed to a chair (including a wheelchair)?: A Lot Help needed standing up from a chair using your arms (e.g., wheelchair or bedside chair)?: A Lot Help needed to walk in hospital room?: A Lot Help needed climbing 3-5 steps with a railing? : A Lot 6 Click Score: 15    End of Session   Activity Tolerance: Patient tolerated treatment well Patient left: in bed;with bed alarm set Nurse Communication: Mobility status PT Visit Diagnosis: Unsteadiness on feet (R26.81);Other abnormalities of gait and mobility (R26.89);Muscle weakness (generalized) (M62.81)     Time: 8492-8480 PT Time Calculation (min) (ACUTE ONLY): 12 min  Charges:    $Gait Training: 8-22 mins PT General Charges $$ ACUTE PT VISIT: 1 Visit                    Rankin Essex PTA 10/15/23, 4:13 PM

## 2023-10-15 NOTE — Progress Notes (Signed)
 Attempted to call report, receiving nurse to call back for report.

## 2023-10-15 NOTE — Progress Notes (Signed)
 PROGRESS NOTE    Henry Mcmahon  FMW:[id number] DOB: [birth year] DOA: 10/12/2023 PCP: Zackery Marsa Hacker, MD  No chief complaint on file.   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  Patient underwent ICD generator change but was found to be bleeding around the ICD.  Bleeding was thought to be excessive and thus patient was admitted to [facility] to monitor overnight.  Soon after admission bleeding worsened and patient became hypotensive, unresponsive.  Reportedly SBP during this admit was 72.  Patient received IV fluids. Code stroke was called but later canceled as it felt the patient's altered mentation was secondary to hypotension.  His mentation gradually improved.  He was admitted to the ICU.  Hemoglobin stabilized however patient was requiring Levophed  to maintain a MAP of 65 or higher.  On 7/24 [facility] assumed care of the patient from ICU.  Subjective: No acute events overnight.  Patient resting easily this morning.  Objective: Vitals:   10/15/23 1230 10/15/23 1300 10/15/23 1331 10/15/23 1514  BP:  (!) 115/53 (!) 119/56 (!) 135/55  Pulse: 71 75 65 66  Resp: 18 (!) 25 16 14   Temp:   98.3 F (36.8 C) 98.4 F (36.9 C)  TempSrc:      SpO2: 98% 97% 95% 100%  Weight:      Height:        Intake/Output Summary (Last 24 hours) at 10/15/2023 1554 Last data filed at 10/15/2023 0427 Gross per 24 hour  Intake 395.31 ml  Output 300 ml  Net 95.31 ml   Filed Weights   10/12/23 1331 10/14/23 0404 10/15/23 0600  Weight: 60.4 kg 63.6 kg 63.9 kg    Examination: General exam: Appears calm and comfortable, chronically ill-appearing, thin Respiratory system: No work of breathing, symmetric chest wall  expansion Cardiovascular system: S1 & S2 heard, RRR.  Gastrointestinal system: Abdomen is nondistended, soft and nontender.  Neuro: Hard of hearing.  Requires prompting to answer questions. Extremities: Symmetric, expected ROM Skin: No rashes, lesions Psychiatry: mood and affect congruent  Assessment & Plan:  Principal Problem:   ICD (implantable cardioverter-defibrillator) pocket hematoma Active Problems:   Hypotension   Syncope   Shock circulatory (HCC)   Protein-calorie malnutrition, severe    Hemorrhagic shock - Secondary to left chest pocket hematoma following ICD generator change out.  Patient was on Plavix at time of generator change - This was likely also complicated by hypovolemia given patient's poor p.o. intake. - Initially required Levophed  in the ICU.  Improved now - Currently on midodrine .  Continue with tapering.  Holding parameters. - Continue telemetry monitoring - Maintain MAP above 65  Acute blood loss anemia - Hemoglobin downtrending though slowly.  Will continue to monitor closely.  Transfuse if under 7  CAD status post CABG Biventricular ICD with generator change 10/12/2023 Chronic heart failure preserved EF Echocardiogram: EF 60 to 65%, mild mitral valve regurg. - CT chest negative for hemothorax, left-sided pacemaker battery pack with surrounding high density fluid, likely hematoma - Cardiology still following, appreciate input  Leukocytosis - No obvious signs or symptoms of infection at this time. - Antibiotics discontinued yesterday. - Continue to monitor WBC and fever curve - Blood cultures remain negative - UA ordered but not completed.   Episodic altered mentation -  No witnessed events personally.  Per report episodes appear to resolve quickly and spontaneously without evidence of a postictal period.  Appear to be related to hypotension.  Could be revealing areas of prior damage with hypoperfusion though the patient has no notable history of  CVA. - Continue to monitor closely on telemetry - 7/25 head CT without acute intracranial abnormality, does demonstrate left anterior frontal encephalomalacia and moderate to severe chronic microvascular ischemic changes. - Code stroke earlier this admission was canceled as it was thought symptoms were secondary to hypotension. -Little utility in spot EEG.  If event occurs additional time consider EEG or transfer to [facility] for cEEG.  Will involve neurology if needed.  Hypomagnesemia Hypophosphatemia - Replace as needed  COPD - Currently stable on - Resume home meds  Alcohol abuse - 3-4 beers daily, has been counseled on cessation - Monitor closely for withdrawal.  Tobacco abuse -1/4 pack a day, has been counseled on cessation  Generalized weakness - Complicated by comorbidities, and advanced age.  Patient resides with 2 elderly sisters - Home health has been ordered at discharge  DVT prophylaxis: Hold AC. SCDs for now   Code Status: Full Code Disposition: Continue to monitor overnight.  Consultants:    Procedures:  7/22 ICD generator change out  Antimicrobials:  Anti-infectives (From admission, onward)    Start     Dose/Rate Route Frequency Ordered Stop   10/12/23 2000  piperacillin -tazobactam (ZOSYN ) IVPB 3.375 g  Status:  Discontinued        3.375 g 12.5 mL/hr over 240 Minutes Intravenous Every 8 hours 10/12/23 1858 10/14/23 1726   10/12/23 0833  gentamicin  (GARAMYCIN ) 80 mg in sodium chloride  0.9 % 500 mL irrigation  Status:  Discontinued          As needed 10/12/23 0833 10/12/23 1320   10/12/23 0645  ceFAZolin  (ANCEF ) IVPB 2g/100 mL premix        2 g 200 mL/hr over 30 Minutes Intravenous On call 10/12/23 0632 10/12/23 0818   10/12/23 0015  gentamicin  (GARAMYCIN ) 80 mg in sodium chloride  0.9 % 500 mL irrigation  Status:  Discontinued        80 mg Irrigation On call 10/12/23 0004 10/12/23 1320   10/12/23 0000  cephALEXin  (KEFLEX ) 500 MG capsule        500 mg  Oral 2 times daily 10/12/23 9092         Data Reviewed: I have personally reviewed following labs and imaging studies CBC: Recent Labs  Lab 10/12/23 1221 10/12/23 1410 10/12/23 1813 10/13/23 0351 10/14/23 0335 10/15/23 0309  WBC 8.8  --  16.1* 8.1 8.2 6.8  NEUTROABS 6.0  --   --   --  5.9  --   HGB 11.8* 11.8* 11.5* 10.6* 9.4* 8.7*  HCT 34.5* 34.7* 33.9* 31.2* 27.6* 25.3*  MCV 90.1  --  91.6 88.1 89.6 89.4  PLT 168  --  173 163 141* 148*   Basic Metabolic Panel: Recent Labs  Lab 10/12/23 1221 10/13/23 0351 10/14/23 0335 10/15/23 0309  NA 131* 133* 134*  --   K 3.7 4.3 4.1  --   CL 100 102 103  --   CO2 20* 22 24  --   GLUCOSE 116* 128* 79  --   BUN 12 15 11   --   CREATININE 0.49* 0.71 0.58*  --   CALCIUM  8.7* 8.8* 8.5*  --   MG  --   --  1.5* 2.3  PHOS  --   --  2.3* 3.5   GFR: Estimated Creatinine Clearance: 65.5 mL/min (A) (by C-G formula based on SCr of 0.58 mg/dL (L)). Liver Function Tests: Recent Labs  Lab 10/12/23 1221  AST 23  ALT 16  ALKPHOS 44  BILITOT 1.3*  PROT 6.1*  ALBUMIN 3.6   CBG: Recent Labs  Lab 10/12/23 1325 10/14/23 0439  GLUCAP 120* 79    Recent Results (from the past 240 hours)  MRSA Next Gen by PCR, Nasal     Status: None   Collection Time: 10/12/23  1:33 PM   Specimen: Nasal Mucosa; Nasal Swab  Result Value Ref Range Status   MRSA by PCR Next Gen NOT DETECTED NOT DETECTED Final    Comment: (NOTE) The GeneXpert MRSA Assay (FDA approved for NASAL specimens only), is one component of a comprehensive MRSA colonization surveillance program. It is not intended to diagnose MRSA infection nor to guide or monitor treatment for MRSA infections. Test performance is not FDA approved in patients less than 56 years old. Performed at [facility], [address] [place], [place] [address]   Culture, blood (Routine X 2) w Reflex to ID Panel     Status: None (Preliminary result)   Collection Time: 10/12/23  7:38 PM    Specimen: BLOOD  Result Value Ref Range Status   Specimen Description BLOOD BLOOD RIGHT HAND  Final   Special Requests   Final    BOTTLES DRAWN AEROBIC AND ANAEROBIC Blood Culture adequate volume   Culture   Final    NO GROWTH 3 DAYS Performed at [facility], [address], [place], [place] [address]    Report Status PENDING  Incomplete  Culture, blood (Routine X 2) w Reflex to ID Panel     Status: None (Preliminary result)   Collection Time: 10/12/23  9:24 PM   Specimen: BLOOD  Result Value Ref Range Status   Specimen Description BLOOD BLOOD RIGHT HAND  Final   Special Requests Blood Culture adequate volume  Final   Culture   Final    NO GROWTH 3 DAYS Performed at [facility], [address], [place], [place] [address]    Report Status PENDING  Incomplete     Radiology Studies: CT HEAD WO CONTRAST ( ) Result Date: 10/14/2023 CLINICAL DATA:  Mental status change, persistent or worsening intermittent altered mentation, worse with hypotension. EXAM: CT HEAD WITHOUT CONTRAST TECHNIQUE: Contiguous axial images were obtained from the base of the skull through the vertex without intravenous contrast. RADIATION DOSE REDUCTION: This exam was performed according to the departmental dose-optimization program which includes automated exposure control, adjustment of the mA and/or kV according to patient size and/or use of iterative reconstruction technique. COMPARISON:  CT head 10/15/2009. FINDINGS: Brain: No evidence of acute infarction, hemorrhage, hydrocephalus, extra-axial collection or mass lesion/mass effect. Moderate to severe patchy and confluent white matter hypodensities, nonspecific but compatible with chronic microvascular ischemic change. Left anterior frontal encephalomalacia. Vascular: No hyperdense vessel.  Calcific atherosclerosis. Skull: No acute fracture. Sinuses/Orbits: Left maxillary sinus air-fluid level and partial opacification. No acute orbital findings.  Other: No mastoid effusions. IMPRESSION: 1. No evidence of acute intracranial abnormality. 2. Left anterior frontal encephalomalacia. 3. Moderate to severe chronic microvascular ischemic change. 4. Left maxillary sinus air-fluid level and partial opacification. Electronically Signed   By: Gilmore GORMAN Molt M.D.   On: 10/14/2023 20:32    Scheduled Meds:  atorvastatin   40 mg Oral QHS   Chlorhexidine  Gluconate Cloth  6 each Topical Q0600  feeding supplement  237 mL Oral TID BM   folic acid   1 mg Oral Daily   midodrine   5 mg Oral TID WC   multivitamin with minerals  1 tablet Oral Daily   pantoprazole   20 mg Oral Daily   thiamine   100 mg Oral Daily   Continuous Infusions:     LOS: 3 days  MDM: Patient is high risk for one or more organ failure.  They necessitate ongoing hospitalization for continued IV therapies and subsequent lab monitoring. Total time spent interpreting labs and vitals, reviewing the medical record, coordinating care amongst consultants and care team members, directly assessing and discussing care with the patient and/or family: 55 min  Henry Barbee, DO [facility]  To contact the attending physician between 7A-7P please use Epic Chat. To contact the covering physician during after hours 7P-7A, please review Amion.  10/15/2023, 3:54 PM   *This document has been created with the assistance of dictation software. Please excuse typographical errors. *

## 2023-10-15 NOTE — Progress Notes (Signed)
 Patient transferred to room 252 on cardiac monitoring, with all belongings, on hospital bed, and in stable condition.

## 2023-10-15 NOTE — Progress Notes (Signed)
 Report given to receiving nurse Yates, RN

## 2023-10-15 NOTE — Plan of Care (Signed)
  Problem: Education: Goal: Knowledge of cardiac device and self-care will improve Outcome: Progressing   Problem: Cardiac: Goal: Ability to achieve and maintain adequate cardiopulmonary perfusion will improve Outcome: Progressing   Problem: Clinical Measurements: Goal: Diagnostic test results will improve Outcome: Progressing Goal: Respiratory complications will improve Outcome: Progressing Goal: Cardiovascular complication will be avoided Outcome: Progressing   Problem: Skin Integrity: Goal: Risk for impaired skin integrity will decrease Outcome: Progressing

## 2023-10-16 DIAGNOSIS — E43 Unspecified severe protein-calorie malnutrition: Secondary | ICD-10-CM | POA: Diagnosis not present

## 2023-10-16 DIAGNOSIS — T82837A Hemorrhage of cardiac prosthetic devices, implants and grafts, initial encounter: Secondary | ICD-10-CM | POA: Diagnosis not present

## 2023-10-16 DIAGNOSIS — R55 Syncope and collapse: Secondary | ICD-10-CM | POA: Diagnosis not present

## 2023-10-16 LAB — CBC
HCT: 25.5 % — ABNORMAL LOW (ref 39.0–52.0)
Hemoglobin: 8.3 g/dL — ABNORMAL LOW (ref 13.0–17.0)
MCH: 30 pg (ref 26.0–34.0)
MCHC: 32.5 g/dL (ref 30.0–36.0)
MCV: 92.1 fL (ref 80.0–100.0)
Platelets: 170 K/uL (ref 150–400)
RBC: 2.77 MIL/uL — ABNORMAL LOW (ref 4.22–5.81)
RDW: 15.3 % (ref 11.5–15.5)
WBC: 6 K/uL (ref 4.0–10.5)
nRBC: 0 % (ref 0.0–0.2)

## 2023-10-16 LAB — PHOSPHORUS: Phosphorus: 2.9 mg/dL (ref 2.5–4.6)

## 2023-10-16 LAB — BASIC METABOLIC PANEL WITH GFR
Anion gap: 12 (ref 5–15)
BUN: 9 mg/dL (ref 8–23)
CO2: 20 mmol/L — ABNORMAL LOW (ref 22–32)
Calcium: 8.4 mg/dL — ABNORMAL LOW (ref 8.9–10.3)
Chloride: 104 mmol/L (ref 98–111)
Creatinine, Ser: 0.57 mg/dL — ABNORMAL LOW (ref 0.61–1.24)
GFR, Estimated: >60 mL/min (ref >60)
Glucose, Bld: 75 mg/dL (ref 70–99)
Potassium: 4.1 mmol/L (ref 3.5–5.1)
Sodium: 136 mmol/L (ref 135–145)

## 2023-10-16 LAB — MAGNESIUM: Magnesium: 2.1 mg/dL (ref 1.7–2.4)

## 2023-10-16 MED ORDER — FUROSEMIDE 20 MG PO TABS
20.0000 mg | ORAL_TABLET | Freq: Every day | ORAL | Status: DC
  Administered 2023-10-16 – 2023-10-17 (×2): 20 mg via ORAL
  Filled 2023-10-16 (×2): qty 1

## 2023-10-16 NOTE — Progress Notes (Cosign Needed Addendum)
 [facility] CLINIC CARDIOLOGY PROGRESS NOTE       Patient ID: Henry Mcmahon MRN: [id number] DOB/AGE: 1941-02-04 83 y.o.  Admit date: 10/12/2023 Referring Physician None - here for outpatient PPM generator change with complication so was admitted under hospitalist service Primary Physician Borun, Marsa Hacker, MD Primary Cardiologist Dr. Dewane  Reason for Consultation Hematoma s/p ICD generator changeout  HPI: Henry Mcmahon is a 83 y.o. male  with a past medical history of CAD s/p 3v CABG in 2002 (LIMA to LAD, SVG to ramus, SVG to D1) and multiple stents, chronic HFmrEF s/p Biventricular ICD, hypertension, hyperlipidemia who presented for outpatient ICD generator change out on 10/12/2023 due to device at [facility] at pacer clinic.  Patient underwent ICD generator change out with Dr. Nolen s/p revealed significant hematoma, active bleeding and swelling. Of note patient was taking Plavix at home. In the specials recovery event, while holding pressure on incision site for about 10 minutes and patient suddenly became hypotensive, not responding to questions appropriately, not making eye contact.  Code stroke was called and patient started on IV fluids, BP improved and patient became more responsive and started to answer questions appropriately, code stroke canceled at this point.  Patient then transferred to stepdown.  Interval History: -Patient seen and examined this AM and laying comfortably in hospital bed. Patient states he feels fine and continues to deny chest pain, SOB, lightheadedness or any pain.  -Patient BP improved and stable. HR stable. Overnight Tele showed no significant events.  Patient is adequately pacing. -Patient appears euvolemic. -Patient remains on room air with stable SpO2.  -Left chest incision site continues to appear stable, no active bleeding or significant tenderness. Swelling appears stable. With significant bruising which is also stable.  -Hemoglobin 11.8 > 11.5 >  10.6 > 9.4 > 8.7 > 8.3.  Review of systems complete and found to be negative unless listed above    Past Medical History:  Diagnosis Date   Cancer [facility])    prostate   CHF (congestive heart failure) (HCC)    Collagen vascular disease (HCC)    COPD (chronic obstructive pulmonary disease) (HCC)    Coronary artery disease    ETOH abuse    Gastric ulcer    H/O: UGI bleed    History of blood transfusion    HLD (hyperlipidemia)    Hypertension    Myocardial infarction (HCC)    VHD (valvular heart disease)     Past Surgical History:  Procedure Laterality Date   COLON SURGERY     COLONOSCOPY     CORONARY ARTERY BYPASS GRAFT     HEMORRHOID SURGERY     ICD GENERATOR CHANGEOUT N/A 10/12/2023   Procedure: ICD GENERATOR CHANGEOUT;  Surgeon: Ammon Marsa, MD;  Location: [facility] INVASIVE CV LAB;  Service: Cardiovascular;  Laterality: N/A;   INSERT / REPLACE / REMOVE PACEMAKER     LUMBAR DISC SURGERY     MICROLARYNGOSCOPY N/A 02/19/2016   Procedure: MICROLARYNGOSCOPY;  Surgeon: Deward Dolly, MD;  Location: [facility] ORS;  Service: ENT;  Laterality: N/A;   UPPER GASTROINTESTINAL ENDOSCOPY      Medications Prior to Admission  Medication Sig Dispense Refill Last Dose/Taking   atorvastatin  (LIPITOR) 40 MG tablet Take 40 mg by mouth at bedtime.   10/11/2023   cholecalciferol (VITAMIN D3) 25 MCG (1000 UNIT) tablet Take 1,000 Units by mouth daily.   10/12/2023 Morning   furosemide  (LASIX ) 20 MG tablet Take 20 mg by mouth daily.   10/12/2023 Morning  isosorbide mononitrate (IMDUR) 30 MG 24 hr tablet 30 mg daily.   10/12/2023 Morning   lisinopril  (ZESTRIL ) 10 MG tablet Take 10 mg by mouth daily.   10/12/2023 Morning   nitroGLYCERIN (NITROSTAT) 0.4 MG SL tablet Place 0.4 mg under the tongue every 5 (five) minutes as needed for chest pain.   Taking As Needed   pantoprazole  (PROTONIX ) 20 MG tablet Take 20 mg by mouth daily.   10/12/2023 Morning   tamsulosin (FLOMAX) 0.4 MG CAPS capsule Take 0.4 mg by mouth  daily.   10/12/2023 Morning   vitamin B-12 (CYANOCOBALAMIN) 500 MCG tablet Take 500 mcg by mouth daily.   10/12/2023 Morning   Social History   Socioeconomic History   Marital status: Single    Spouse name: Not on file   Number of children: Not on file   Years of education: Not on file   Highest education level: Not on file  Occupational History   Not on file  Tobacco Use   Smoking status: Every Day    Current packs/day: 0.25    Types: Cigarettes   Smokeless tobacco: Never  Substance and Sexual Activity   Alcohol use: Yes    Alcohol/week: 36.0 standard drinks of alcohol    Types: 36 Cans of beer per week   Drug use: No   Sexual activity: Not on file  Other Topics Concern   Not on file  Social History Narrative   Not on file   Social Drivers of Health   Financial Resource Strain: Low Risk  (03/31/2023)   Received from [facility]   Overall Financial Resource Strain (CARDIA)    Difficulty of Paying Living Expenses: Not hard at all  Food Insecurity: Patient Declined (10/12/2023)   Hunger Vital Sign    Worried About Running Out of Food in the Last Year: Patient declined    Ran Out of Food in the Last Year: Patient declined  Transportation Needs: Patient Declined (10/12/2023)   PRAPARE - Administrator, Civil Service (Medical): Patient declined    Lack of Transportation (Non-Medical): Patient declined  Physical Activity: Not on file  Stress: Not on file  Social Connections: Patient Declined (10/12/2023)   Social Connection and Isolation Panel    Frequency of Communication with Friends and Family: Patient declined    Frequency of Social Gatherings with Friends and Family: Patient declined    Attends Religious Services: Patient declined    Database administrator or Organizations: Patient declined    Attends Banker Meetings: Patient declined    Marital Status: Patient declined  Intimate Partner Violence: Patient Declined (10/12/2023)    Humiliation, Afraid, Rape, and Kick questionnaire    Fear of Current or Ex-Partner: Patient declined    Emotionally Abused: Patient declined    Physically Abused: Patient declined    Sexually Abused: Patient declined    History reviewed. No pertinent family history.   Vitals:   10/16/23 0000 10/16/23 0404 10/16/23 0408 10/16/23 0813  BP:  (!) 156/61  128/64  Pulse:  63  65  Resp:  18  10  Temp: 97.6 F (36.4 C) (!) 97.5 F (36.4 C)  (!) 97.5 F (36.4 C)  TempSrc: Oral   Oral  SpO2:  97%    Weight:   63 kg   Height:        PHYSICAL EXAM General: Chronically ill appearing elderly male, well nourished, in no acute distress. HEENT: Normocephalic and atraumatic. Neck: No JVD.  Lungs: Normal respiratory effort on room air. Clear bilaterally to auscultation. No wheezes, crackles, rhonchi.  Heart: HRRR. Normal S1 and S2 without gallops or murmurs.  Abdomen: Non-distended appearing.  Msk: Normal strength and tone for age. Extremities: Warm and well perfused. No clubbing, cyanosis, edema.  Neuro: Alert and oriented X 3. Psych: Answers questions appropriately.   Labs: Basic Metabolic Panel: Recent Labs    10/14/23 0335 10/15/23 0309 10/16/23 0512  NA 134*  --  136  K 4.1  --  4.1  CL 103  --  104  CO2 24  --  20*  GLUCOSE 79  --  75  BUN 11  --  9  CREATININE 0.58*  --  0.57*  CALCIUM  8.5*  --  8.4*  MG 1.5* 2.3 2.1  PHOS 2.3* 3.5 2.9   Liver Function Tests: No results for input(s): AST, ALT, ALKPHOS, BILITOT, PROT, ALBUMIN in the last 72 hours.  No results for input(s): LIPASE, AMYLASE in the last 72 hours. CBC: Recent Labs    10/14/23 0335 10/15/23 0309 10/16/23 0512  WBC 8.2 6.8 6.0  NEUTROABS 5.9  --   --   HGB 9.4* 8.7* 8.3*  HCT 27.6* 25.3* 25.5*  MCV 89.6 89.4 92.1  PLT 141* 148* 170   Cardiac Enzymes: No results for input(s): CKTOTAL, CKMB, CKMBINDEX, TROPONINIHS in the last 72 hours.  BNP: No results for input(s): BNP  in the last 72 hours. D-Dimer: No results for input(s): DDIMER in the last 72 hours. Hemoglobin A1C: No results for input(s): HGBA1C in the last 72 hours. Fasting Lipid Panel: No results for input(s): CHOL, HDL, LDLCALC, TRIG, CHOLHDL, LDLDIRECT in the last 72 hours. Thyroid Function Tests: No results for input(s): TSH, T4TOTAL, T3FREE, THYROIDAB in the last 72 hours.  Invalid input(s): FREET3 Anemia Panel: No results for input(s): VITAMINB12, FOLATE, FERRITIN, TIBC, IRON, RETICCTPCT in the last 72 hours.    Radiology: CT HEAD WO CONTRAST ( ) Result Date: 10/14/2023 CLINICAL DATA:  Mental status change, persistent or worsening intermittent altered mentation, worse with hypotension. EXAM: CT HEAD WITHOUT CONTRAST TECHNIQUE: Contiguous axial images were obtained from the base of the skull through the vertex without intravenous contrast. RADIATION DOSE REDUCTION: This exam was performed according to the departmental dose-optimization program which includes automated exposure control, adjustment of the mA and/or kV according to patient size and/or use of iterative reconstruction technique. COMPARISON:  CT head 10/15/2009. FINDINGS: Brain: No evidence of acute infarction, hemorrhage, hydrocephalus, extra-axial collection or mass lesion/mass effect. Moderate to severe patchy and confluent white matter hypodensities, nonspecific but compatible with chronic microvascular ischemic change. Left anterior frontal encephalomalacia. Vascular: No hyperdense vessel.  Calcific atherosclerosis. Skull: No acute fracture. Sinuses/Orbits: Left maxillary sinus air-fluid level and partial opacification. No acute orbital findings. Other: No mastoid effusions. IMPRESSION: 1. No evidence of acute intracranial abnormality. 2. Left anterior frontal encephalomalacia. 3. Moderate to severe chronic microvascular ischemic change. 4. Left maxillary sinus air-fluid level and partial  opacification. Electronically Signed   By: Gilmore GORMAN Molt M.D.   On: 10/14/2023 20:32   ECHOCARDIOGRAM COMPLETE Result Date: 10/13/2023    ECHOCARDIOGRAM REPORT   Patient Name:   Henry Mcmahon Date of Exam: 10/12/2023 Medical Rec #:  [id number]      Height:       69.0 in Accession #:    [id number]     Weight:       138.0 lb Date of Birth:  02-12-1941       BSA:  1.765 m Patient Age:    83 years       BP:           101/62 mmHg Patient Gender: M              HR:           81 bpm. Exam Location:  [facility] Procedure: 2D Echo, Cardiac Doppler and Color Doppler (Both Spectral and Color            Flow Doppler were utilized during procedure). Indications:     Syncope R55                  Cardiomyopathy-unspecified I42.9  History:         Patient has no prior history of Echocardiogram examinations.                  CHF, Previous Myocardial Infarction, COPD; Risk                  Factors:Hypertension.  Sonographer:     Christopher Furnace Referring Phys:  [id number] AIDA CHO Diagnosing Phys: Marsa Dooms MD  Sonographer Comments: Technically difficult study due to poor echo windows, Technically challenging study due to limited acoustic windows, no apical window and no subcostal window. IMPRESSIONS  1. Left ventricular ejection fraction, by estimation, is 60 to 65%. The left ventricle has normal function. The left ventricle has no regional wall motion abnormalities. Left ventricular diastolic parameters were normal.  2. Right ventricular systolic function is normal. The right ventricular size is normal.  3. The mitral valve is normal in structure. Mild mitral valve regurgitation. No evidence of mitral stenosis.  4. The aortic valve is normal in structure. Aortic valve regurgitation is not visualized. No aortic stenosis is present.  5. There is mild dilatation of the aortic root, measuring 43 mm.  6. The inferior vena cava is normal in size with greater than 50% respiratory variability, suggesting right atrial pressure  of 3 mmHg. FINDINGS  Left Ventricle: Left ventricular ejection fraction, by estimation, is 60 to 65%. The left ventricle has normal function. The left ventricle has no regional wall motion abnormalities. Strain was performed and the global longitudinal strain is indeterminate. The left ventricular internal cavity size was normal in size. There is no left ventricular hypertrophy. Left ventricular diastolic parameters were normal. Right Ventricle: The right ventricular size is normal. No increase in right ventricular wall thickness. Right ventricular systolic function is normal. Left Atrium: Left atrial size was normal in size. Right Atrium: Right atrial size was normal in size. Pericardium: There is no evidence of pericardial effusion. Mitral Valve: The mitral valve is normal in structure. Mild mitral valve regurgitation. No evidence of mitral valve stenosis. Tricuspid Valve: The tricuspid valve is normal in structure. Tricuspid valve regurgitation is mild . No evidence of tricuspid stenosis. Aortic Valve: The aortic valve is normal in structure. Aortic valve regurgitation is not visualized. No aortic stenosis is present. Pulmonic Valve: The pulmonic valve was normal in structure. Pulmonic valve regurgitation is not visualized. No evidence of pulmonic stenosis. Aorta: The aortic root is normal in size and structure. There is mild dilatation of the aortic root, measuring 43 mm. Venous: The inferior vena cava is normal in size with greater than 50% respiratory variability, suggesting right atrial pressure of 3 mmHg. IAS/Shunts: No atrial level shunt detected by color flow Doppler. Additional Comments: 3D was performed not requiring image post processing on an independent workstation and was indeterminate.  LEFT VENTRICLE PLAX 2D LVIDd:         3.30 cm LVIDs:         2.20 cm LV PW:         1.50 cm LV IVS:        1.60 cm LVOT diam:     2.30 cm LVOT Area:     4.15 cm  LEFT ATRIUM         Index LA diam:    3.80 cm 2.15  cm/m   AORTA Ao Root diam: 4.10 cm  SHUNTS Systemic Diam: 2.30 cm Marsa Dooms MD Electronically signed by Marsa Dooms MD Signature Date/Time: 10/13/2023/8:09:42 AM    Final    CT CHEST WO CONTRAST Result Date: 10/12/2023 CLINICAL DATA:  Provided history: Chest wall pain, nontraumatic, no prior imaging Hematoma post pacemaker generator change out, rule out hemothorax. EXAM: CT CHEST WITHOUT CONTRAST TECHNIQUE: Multidetector CT imaging of the chest was performed following the standard protocol without IV contrast. RADIATION DOSE REDUCTION: This exam was performed according to the departmental dose-optimization program which includes automated exposure control, adjustment of the mA and/or kV according to patient size and/or use of iterative reconstruction technique. COMPARISON:  Chest radiograph 04/26/2012.  Chest CT 07/25/2009 FINDINGS: Cardiovascular: Left subclavian pacemaker with lead tips in the right atrium, right ventricle and coronary sinus, moderate associated streak artifact. No pericardial effusion. The heart is normal in size. Native coronary artery calcifications, post CABG. Aortic atherosclerosis. 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 Descending aorta is tortuous. No periaortic stranding. Mediastinum/Nodes: Small mediastinal lymph nodes are not enlarged by size criteria. Limited hilar assessment on this unenhanced exam. Patulous esophagus contains small amount of intraluminal fluid. Small hiatal hernia Lungs/Pleura: Moderate emphysema. No pneumothorax. No pleural fluid. Scattered areas of subsegmental atelectasis or scarring. Subpleural reticulation within the anterior lungs, chronic and likely related to scarring. No features of pulmonary edema. No suspicious pulmonary nodule. Upper Abdomen: Symmetric perinephric edema is partially included. Gallstone. Musculoskeletal: Left-sided pacemaker battery pack with surrounding regional streak artifact. High-density fluid surrounding the  battery pack is best evaluated inferiorly spanning 8.4 cm transverse, series 2, image 79. Subcutaneous gas within the collection is most prominent medially. Surrounding soft tissue edema extends inferiorly in the anterior chest wall, as well as superiorly into the supraclavicular soft tissues. Prior median sternotomy. Remote bilateral rib fractures. No acute osseous findings. IMPRESSION: 1. Left-sided pacemaker battery pack with surrounding high-density fluid, likely hematoma. Subcutaneous gas within the collection is most prominent medially. Surrounding soft tissue edema extends inferiorly in the anterior chest wall, as well as superiorly into the supraclavicular soft tissues. Sterility is technically indeterminate by imaging. 2. No pleural fluid, hemothorax or pneumothorax. 3. Emphysema. 4. Dilated ascending aorta at 4.1 cm. Recommend annual imaging followup by CTA or MRA. This recommendation follows 2010 ACCF/AHA/AATS/ACR/ASA/SCA/SCAI/SIR/STS/SVM Guidelines for the Diagnosis and Management of Patients with Thoracic Aortic Disease. Circulation. 2010; 121: Z733-z630. Aortic aneurysm NOS (ICD10-I71.9) Aortic Atherosclerosis (ICD10-I70.0) and Emphysema (ICD10-J43.9). Electronically Signed   By: Andrea Gasman M.D.   On: 10/12/2023 19:33   EP PPM/ICD IMPLANT Result Date: 10/12/2023 Successful BiV ICD generator change out (Medtronic Cobalt XT HF Quad)    ECHO as above.  TELEMETRY reviewed by me 10/16/2023: ventricular paced, rate 70s  EKG reviewed by me: ordered  Data reviewed by me 10/16/2023: last 24h vitals tele labs imaging I/O hospitalist note, critical care notes.  Principal Problem:   ICD (implantable cardioverter-defibrillator) pocket hematoma Active Problems:   Hypotension  Syncope   Shock circulatory (HCC)   Protein-calorie malnutrition, severe    ASSESSMENT AND PLAN:  Henry Mcmahon is a 83 y.o. male  with a past medical history of CAD s/p 3v CABG in 2002 (LIMA to LAD, SVG to ramus,  SVG to D1) and multiple stents, chronic HFmrEF s/p Biventricular ICD, hypertension, hyperlipidemia who presented for outpatient ICD generator change out on 10/12/2023 due to device at [facility] at pacer clinic.  Patient underwent ICD generator change out with Dr. Nolen s/p revealed significant hematoma, active bleeding and swelling. Of note patient was taking Plavix at home. In the specials recovery event, while holding pressure on incision site for about 10 minutes and patient suddenly became hypotensive, not responding to questions appropriately, not making eye contact.  Code stroke was called and patient started on IV fluids, BP improved and patient became more responsive and started to answer questions appropriately, code stroke canceled at this point.  Patient then transferred to stepdown.  # Shock, resolved # CAD s/p CABG x 3 and multiple stents (last [facility] 2016) # s/p Biventricular ICD  # AMS s/p generator changeout (07/22) # Chronic HFmrEF Patients presents for outpatient ICD generator changeout, at incision site had complications with hematoma and swelling. Trops negative x2.  Lactic elevated at 2.3. Patient appears euvolemic. BP borderline without pressors, improved s/p IVF. Patient required pressors from 07/22 to 07/23. Off pressors since 07/23 and BP much improved.  Hemoglobin stable.  Echo this admission with preserved EF of 60-65%, no RWMA.  Per telemetry adequately ventricular paced. -Will continue to monitor left chest hematoma. Much improved, remains stable. -Continue to monitor H&H -Hold Plavix. Do not resume at discharge, will be discussed at outpatient cardiology follow-up. Likely would only recommend ASA 81 mg daily. -Continue atorvastatin  40 mg daily. -Discontinue midodrine  due to improvement in BP. Consider resuming home BP meds tomorrow as able. -Resume home lasix  20 mg daily.   This patient's plan of care was discussed and created with Dr. Florencio and he is in  agreement.  Signed: Danita Bloch, PA-C  10/16/2023, 10:03 AM [facility] Cardiology

## 2023-10-16 NOTE — Progress Notes (Signed)
 PROGRESS NOTE    Henry Mcmahon  FMW:[id number] DOB: May 27, 1940 DOA: 10/12/2023 PCP: Zackery Marsa Hacker, MD  No chief complaint on file.   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  Patient underwent ICD generator change but was found to be bleeding around the ICD.  Bleeding was thought to be excessive and thus patient was admitted to [facility] to monitor overnight.  Soon after admission bleeding worsened and patient became hypotensive, unresponsive.  Reportedly SBP during this admit was 72.  Patient received IV fluids. Code stroke was called but later canceled as it felt the patient's altered mentation was secondary to hypotension.  His mentation gradually improved.  He was admitted to the ICU.  Hemoglobin stabilized however patient was requiring Levophed  to maintain a MAP of 65 or higher.  On 7/24 [facility] assumed care of the patient from ICU.  Subjective: No acute events overnight.  Patient is irritated to still be in the hospital.  He does not want to discuss this further on exam  Objective: Vitals:   10/16/23 0000 10/16/23 0404 10/16/23 0408 10/16/23 0813  BP:  (!) 156/61  128/64  Pulse:  63  65  Resp:  18  10  Temp: 97.6 F (36.4 C) (!) 97.5 F (36.4 C)  (!) 97.5 F (36.4 C)  TempSrc: Oral   Oral  SpO2:  97%    Weight:   63 kg   Height:        Intake/Output Summary (Last 24 hours) at 10/16/2023 1437 Last data filed at 10/15/2023 2053 Gross per 24 hour  Intake 120 ml  Output --  Net 120 ml   Filed Weights   10/14/23 0404 10/15/23 0600 10/16/23 0408  Weight: 63.6 kg 63.9 kg 63 kg    Examination: General exam: Appears calm and comfortable, chronically ill-appearing, thin Respiratory system:  No work of breathing, symmetric chest wall expansion Cardiovascular system: S1 & S2 heard, RRR.  Pacemaker incision site freshly bandaged, clean dry and intact. Gastrointestinal system: Abdomen is nondistended, soft and nontender.  Neuro: Hard of hearing.  Requires prompting to answer questions. Extremities: Symmetric, expected ROM Skin: Left arm bruising over majority of forearm and bicep area. Psychiatry: mood and affect congruent  Assessment & Plan:  Principal Problem:   ICD (implantable cardioverter-defibrillator) pocket hematoma Active Problems:   Hypotension   Syncope   Shock circulatory (HCC)   Protein-calorie malnutrition, severe    Hemorrhagic shock - Secondary to left chest pocket hematoma following ICD generator change out.  Patient was on Plavix at time of generator change - This was likely also complicated by hypovolemia given patient's poor p.o. intake. - Initially required Levophed  in the ICU.  Improved now - Was on midodrine .  I discontinued now.  Follow blood pressure closely. - Continue monitoring on telemetry - Maintain MAP above 65  Acute blood loss anemia - Hgb continues to downtrend though slowly - Continue to monitor.  If stabilizes tomorrow may be able to discharge - Transfuse if under 7   CAD status post CABG Biventricular ICD with generator change 10/12/2023 Chronic heart failure preserved EF Echocardiogram: EF 60 to 65%, mild mitral valve regurg. - CT chest negative for hemothorax, left-sided pacemaker battery pack with surrounding high density fluid, likely hematoma -  Cardiology still following, appreciate input  Leukocytosis - No obvious signs or symptoms of infection at this time. - Biotics discontinued - Continue monitor WBC and fever curve - Blood cultures negative at 4 days - UA ordered but not completed.   Episodic altered mentation - No witnessed events personally.  Per report episodes appear to resolve quickly and spontaneously without  evidence of a postictal period.  Appear to be related to hypotension.  Could be revealing areas of prior damage with hypoperfusion though the patient has no notable history of CVA. - 7/25 head CT without acute intracranial abnormality, does demonstrate left anterior frontal encephalomalacia and moderate to severe chronic microvascular ischemic changes. - Code stroke earlier this admission was canceled as it was thought symptoms were secondary to hypotension. -Little utility in spot EEG.  If event occurs additional time consider EEG or transfer to [facility] for cEEG.  Will involve neurology if needed. - Continue monitoring closely on telemetry.  No further events witnessed  Hypomagnesemia Hypophosphatemia - Replace as needed  COPD - Currently stable  - Resume home meds  Alcohol abuse - 3-4 beers daily, has been counseled on cessation - Monitor closely for withdrawal.  Tobacco abuse -1/4 pack a day, has been counseled on cessation  Generalized weakness - Complicated by comorbidities, and advanced age.  Patient resides with 2 elderly sisters - Home health has been ordered at discharge  DVT prophylaxis: Hold AC. SCDs for now   Code Status: Full Code Disposition: Continue to monitor overnight.  Consultants:    Procedures:  7/22 ICD generator change out  Antimicrobials:  Anti-infectives (From admission, onward)    Start     Dose/Rate Route Frequency Ordered Stop   10/12/23 2000  piperacillin -tazobactam (ZOSYN ) IVPB 3.375 g  Status:  Discontinued        3.375 g 12.5 mL/hr over 240 Minutes Intravenous Every 8 hours 10/12/23 1858 10/14/23 1726   10/12/23 0833  gentamicin  (GARAMYCIN ) 80 mg in sodium chloride  0.9 % 500 mL irrigation  Status:  Discontinued          As needed 10/12/23 0833 10/12/23 1320   10/12/23 0645  ceFAZolin  (ANCEF ) IVPB 2g/100 mL premix        2 g 200 mL/hr over 30 Minutes Intravenous On call 10/12/23 0632 10/12/23 0818   10/12/23 0015  gentamicin  (GARAMYCIN )  80 mg in sodium chloride  0.9 % 500 mL irrigation  Status:  Discontinued        80 mg Irrigation On call 10/12/23 0004 10/12/23 1320   10/12/23 0000  cephALEXin  (KEFLEX ) 500 MG capsule        500 mg Oral 2 times daily 10/12/23 9092         Data Reviewed: I have personally reviewed following labs and imaging studies CBC: Recent Labs  Lab 10/12/23 1221 10/12/23 1410 10/12/23 1813 10/13/23 0351 10/14/23 0335 10/15/23 0309 10/16/23 0512  WBC 8.8  --  16.1* 8.1 8.2 6.8 6.0  NEUTROABS 6.0  --   --   --  5.9  --   --   HGB 11.8*   < > 11.5* 10.6* 9.4* 8.7* 8.3*  HCT 34.5*   < > 33.9* 31.2* 27.6* 25.3* 25.5*  MCV 90.1  --  91.6 88.1 89.6 89.4 92.1  PLT 168  --  173 163 141* 148* 170   < > = values in this interval not displayed.   Basic Metabolic Panel: Recent Labs  Lab 10/12/23 1221 10/13/23 0351 10/14/23 0335 10/15/23  0309 10/16/23 0512  NA 131* 133* 134*  --  136  K 3.7 4.3 4.1  --  4.1  CL 100 102 103  --  104  CO2 20* 22 24  --  20*  GLUCOSE 116* 128* 79  --  75  BUN 12 15 11   --  9  CREATININE 0.49* 0.71 0.58*  --  0.57*  CALCIUM  8.7* 8.8* 8.5*  --  8.4*  MG  --   --  1.5* 2.3 2.1  PHOS  --   --  2.3* 3.5 2.9   GFR: Estimated Creatinine Clearance: 64.5 mL/min (A) (by C-G formula based on SCr of 0.57 mg/dL (L)). Liver Function Tests: Recent Labs  Lab 10/12/23 1221  AST 23  ALT 16  ALKPHOS 44  BILITOT 1.3*  PROT 6.1*  ALBUMIN 3.6   CBG: Recent Labs  Lab 10/12/23 1325 10/14/23 0439  GLUCAP 120* 79    Recent Results (from the past 240 hours)  MRSA Next Gen by PCR, Nasal     Status: None   Collection Time: 10/12/23  1:33 PM   Specimen: Nasal Mucosa; Nasal Swab  Result Value Ref Range Status   MRSA by PCR Next Gen NOT DETECTED NOT DETECTED Final    Comment: (NOTE) The GeneXpert MRSA Assay (FDA approved for NASAL specimens only), is one component of a comprehensive MRSA colonization surveillance program. It is not intended to diagnose MRSA infection  nor to guide or monitor treatment for MRSA infections. Test performance is not FDA approved in patients less than 86 years old. Performed at [facility], [address] [place], [place] [address]   Culture, blood (Routine X 2) w Reflex to ID Panel     Status: None (Preliminary result)   Collection Time: 10/12/23  7:38 PM   Specimen: BLOOD  Result Value Ref Range Status   Specimen Description BLOOD BLOOD RIGHT HAND  Final   Special Requests   Final    BOTTLES DRAWN AEROBIC AND ANAEROBIC Blood Culture adequate volume   Culture   Final    NO GROWTH 4 DAYS Performed at [facility], [address] [place], [place] [address]    Report Status PENDING  Incomplete  Culture, blood (Routine X 2) w Reflex to ID Panel     Status: None (Preliminary result)   Collection Time: 10/12/23  9:24 PM   Specimen: BLOOD  Result Value Ref Range Status   Specimen Description BLOOD BLOOD RIGHT HAND  Final   Special Requests Blood Culture adequate volume  Final   Culture   Final    NO GROWTH 4 DAYS Performed at [facility], [address], [place], [place] [address]    Report Status PENDING  Incomplete     Radiology Studies: CT HEAD WO CONTRAST ( ) Result Date: 10/14/2023 CLINICAL DATA:  Mental status change, persistent or worsening intermittent altered mentation, worse with hypotension. EXAM: CT HEAD WITHOUT CONTRAST TECHNIQUE: Contiguous axial images were obtained from the base of the skull through the vertex without intravenous contrast. RADIATION DOSE REDUCTION: This exam was performed according to the departmental dose-optimization program which includes automated exposure control, adjustment of the mA and/or kV according to patient size and/or use of iterative reconstruction technique. COMPARISON:  CT head 10/15/2009. FINDINGS: Brain: No evidence of acute infarction, hemorrhage, hydrocephalus, extra-axial collection or mass lesion/mass effect. Moderate to severe patchy  and confluent white matter hypodensities, nonspecific but compatible with chronic microvascular ischemic change. Left anterior frontal encephalomalacia.  Vascular: No hyperdense vessel.  Calcific atherosclerosis. Skull: No acute fracture. Sinuses/Orbits: Left maxillary sinus air-fluid level and partial opacification. No acute orbital findings. Other: No mastoid effusions. IMPRESSION: 1. No evidence of acute intracranial abnormality. 2. Left anterior frontal encephalomalacia. 3. Moderate to severe chronic microvascular ischemic change. 4. Left maxillary sinus air-fluid level and partial opacification. Electronically Signed   By: Gilmore GORMAN Molt M.D.   On: 10/14/2023 20:32    Scheduled Meds:  atorvastatin   40 mg Oral QHS   feeding supplement  237 mL Oral TID BM   folic acid   1 mg Oral Daily   furosemide   20 mg Oral Daily   multivitamin with minerals  1 tablet Oral Daily   pantoprazole   20 mg Oral Daily   thiamine   100 mg Oral Daily   Continuous Infusions:     LOS: 4 days  MDM: Patient is high risk for one or more organ failure.  They necessitate ongoing hospitalization for continued IV therapies and subsequent lab monitoring. Total time spent interpreting labs and vitals, reviewing the medical record, coordinating care amongst consultants and care team members, directly assessing and discussing care with the patient and/or family: 55 min  [place], DO [facility]  To contact the attending physician between 7A-7P please use Epic Chat. To contact the covering physician during after hours 7P-7A, please review Amion.  10/16/2023, 2:37 PM   *This document has been created with the assistance of dictation software. Please excuse typographical errors. *

## 2023-10-17 DIAGNOSIS — R55 Syncope and collapse: Secondary | ICD-10-CM | POA: Diagnosis not present

## 2023-10-17 DIAGNOSIS — I959 Hypotension, unspecified: Secondary | ICD-10-CM | POA: Diagnosis not present

## 2023-10-17 DIAGNOSIS — T82837A Hemorrhage of cardiac prosthetic devices, implants and grafts, initial encounter: Secondary | ICD-10-CM | POA: Diagnosis not present

## 2023-10-17 DIAGNOSIS — E43 Unspecified severe protein-calorie malnutrition: Secondary | ICD-10-CM | POA: Diagnosis not present

## 2023-10-17 LAB — CBC
HCT: 29.4 % — ABNORMAL LOW (ref 39.0–52.0)
Hemoglobin: 9.8 g/dL — ABNORMAL LOW (ref 13.0–17.0)
MCH: 30.8 pg (ref 26.0–34.0)
MCHC: 33.3 g/dL (ref 30.0–36.0)
MCV: 92.5 fL (ref 80.0–100.0)
Platelets: 228 K/uL (ref 150–400)
RBC: 3.18 MIL/uL — ABNORMAL LOW (ref 4.22–5.81)
RDW: 15.3 % (ref 11.5–15.5)
WBC: 7.7 K/uL (ref 4.0–10.5)
nRBC: 0 % (ref 0.0–0.2)

## 2023-10-17 LAB — CULTURE, BLOOD (ROUTINE X 2)
Culture: NO GROWTH
Culture: NO GROWTH
Special Requests: ADEQUATE
Special Requests: ADEQUATE

## 2023-10-17 LAB — PHOSPHORUS: Phosphorus: 3.1 mg/dL (ref 2.5–4.6)

## 2023-10-17 LAB — MAGNESIUM: Magnesium: 1.8 mg/dL (ref 1.7–2.4)

## 2023-10-17 MED ORDER — HYDROCODONE-ACETAMINOPHEN 7.5-325 MG PO TABS: 1.0000 | ORAL_TABLET | Freq: Four times a day (QID) | ORAL | 0 refills | Status: AC | PRN

## 2023-10-17 MED ORDER — LISINOPRIL 10 MG PO TABS: 10.0000 mg | ORAL_TABLET | Freq: Every day | ORAL | Status: DC

## 2023-10-17 NOTE — Progress Notes (Signed)
 [name] CLINIC CARDIOLOGY PROGRESS NOTE       Patient ID: Henry Mcmahon MRN: [id number] DOB/AGE: 09/15/1940 83 y.o.  Admit date: 10/12/2023 Referring Physician None - here for outpatient PPM generator change with complication so was admitted under hospitalist service Primary Physician Borun, Marsa Hacker, MD Primary Cardiologist Dr. Dewane  Reason for Consultation Hematoma s/p ICD generator changeout  HPI: Henry Mcmahon is a 83 y.o. male  with a past medical history of CAD s/p 3v CABG in 2002 (LIMA to LAD, SVG to ramus, SVG to D1) and multiple stents, chronic HFmrEF s/p Biventricular ICD, hypertension, hyperlipidemia who presented for outpatient ICD generator change out on 10/12/2023 due to device at [facility] at pacer clinic.  Patient underwent ICD generator change out with Dr. Nolen s/p revealed significant hematoma, active bleeding and swelling. Of note patient was taking Plavix at home. In the specials recovery event, while holding pressure on incision site for about 10 minutes and patient suddenly became hypotensive, not responding to questions appropriately, not making eye contact.  Code stroke was called and patient started on IV fluids, BP improved and patient became more responsive and started to answer questions appropriately, code stroke canceled at this point.  Patient then transferred to stepdown.  Interval History: -Patient seen and examined this AM and laying comfortably in hospital bed. Patient states he feels fine and continues to deny chest pain, SOB, lightheadedness or any pain.  -BP and HR remain stable. Overnight Tele showed no significant events.  Patient is adequately pacing. -Left chest device site continues to appear stable, no active bleeding or significant tenderness. Swelling appears stable. With significant bruising which is also stable.  -Hemoglobin 11.8 > 11.5 > 10.6 > 9.4 > 8.7 > 8.3 > 9.8.  Review of systems complete and found to be negative unless listed  above    Past Medical History:  Diagnosis Date   Cancer [facility])    prostate   CHF (congestive heart failure) (HCC)    Collagen vascular disease (HCC)    COPD (chronic obstructive pulmonary disease) (HCC)    Coronary artery disease    ETOH abuse    Gastric ulcer    H/O: UGI bleed    History of blood transfusion    HLD (hyperlipidemia)    Hypertension    Myocardial infarction (HCC)    VHD (valvular heart disease)     Past Surgical History:  Procedure Laterality Date   COLON SURGERY     COLONOSCOPY     CORONARY ARTERY BYPASS GRAFT     HEMORRHOID SURGERY     ICD GENERATOR CHANGEOUT N/A 10/12/2023   Procedure: ICD GENERATOR CHANGEOUT;  Surgeon: Ammon Marsa, MD;  Location: [facility] INVASIVE CV LAB;  Service: Cardiovascular;  Laterality: N/A;   INSERT / REPLACE / REMOVE PACEMAKER     LUMBAR DISC SURGERY     MICROLARYNGOSCOPY N/A 02/19/2016   Procedure: MICROLARYNGOSCOPY;  Surgeon: Deward Dolly, MD;  Location: [facility] ORS;  Service: ENT;  Laterality: N/A;   UPPER GASTROINTESTINAL ENDOSCOPY      Medications Prior to Admission  Medication Sig Dispense Refill Last Dose/Taking   atorvastatin  (LIPITOR) 40 MG tablet Take 40 mg by mouth at bedtime.   10/11/2023   cholecalciferol (VITAMIN D3) 25 MCG (1000 UNIT) tablet Take 1,000 Units by mouth daily.   10/12/2023 Morning   furosemide  (LASIX ) 20 MG tablet Take 20 mg by mouth daily.   10/12/2023 Morning   isosorbide mononitrate (IMDUR) 30 MG 24 hr tablet 30 mg  daily.   10/12/2023 Morning   lisinopril  (ZESTRIL ) 10 MG tablet Take 10 mg by mouth daily.   10/12/2023 Morning   nitroGLYCERIN (NITROSTAT) 0.4 MG SL tablet Place 0.4 mg under the tongue every 5 (five) minutes as needed for chest pain.   Taking As Needed   pantoprazole  (PROTONIX ) 20 MG tablet Take 20 mg by mouth daily.   10/12/2023 Morning   tamsulosin (FLOMAX) 0.4 MG CAPS capsule Take 0.4 mg by mouth daily.   10/12/2023 Morning   vitamin B-12 (CYANOCOBALAMIN) 500 MCG tablet Take 500 mcg by  mouth daily.   10/12/2023 Morning   Social History   Socioeconomic History   Marital status: Single    Spouse name: Not on file   Number of children: Not on file   Years of education: Not on file   Highest education level: Not on file  Occupational History   Not on file  Tobacco Use   Smoking status: Every Day    Current packs/day: 0.25    Types: Cigarettes   Smokeless tobacco: Never  Substance and Sexual Activity   Alcohol use: Yes    Alcohol/week: 36.0 standard drinks of alcohol    Types: 36 Cans of beer per week   Drug use: No   Sexual activity: Not on file  Other Topics Concern   Not on file  Social History Narrative   Not on file   Social Drivers of Health   Financial Resource Strain: Low Risk  (03/31/2023)   Received from [facility]   Overall Financial Resource Strain (CARDIA)    Difficulty of Paying Living Expenses: Not hard at all  Food Insecurity: Patient Declined (10/12/2023)   Hunger Vital Sign    Worried About Running Out of Food in the Last Year: Patient declined    Ran Out of Food in the Last Year: Patient declined  Transportation Needs: Patient Declined (10/12/2023)   PRAPARE - Administrator, Civil Service (Medical): Patient declined    Lack of Transportation (Non-Medical): Patient declined  Physical Activity: Not on file  Stress: Not on file  Social Connections: Patient Declined (10/12/2023)   Social Connection and Isolation Panel    Frequency of Communication with Friends and Family: Patient declined    Frequency of Social Gatherings with Friends and Family: Patient declined    Attends Religious Services: Patient declined    Database administrator or Organizations: Patient declined    Attends Banker Meetings: Patient declined    Marital Status: Patient declined  Intimate Partner Violence: Patient Declined (10/12/2023)   Humiliation, Afraid, Rape, and Kick questionnaire    Fear of Current or Ex-Partner:  Patient declined    Emotionally Abused: Patient declined    Physically Abused: Patient declined    Sexually Abused: Patient declined    History reviewed. No pertinent family history.   Vitals:   10/16/23 2327 10/17/23 0258 10/17/23 0500 10/17/23 0849  BP: (!) 123/51 127/65  (!) 139/54  Pulse: 69 61  80  Resp: 20 16  17   Temp: 97.7 F (36.5 C) 97.6 F (36.4 C)  97.6 F (36.4 C)  TempSrc:    Oral  SpO2: 100% 98%  98%  Weight:   61.7 kg   Height:        PHYSICAL EXAM General: Chronically ill appearing elderly male, well nourished, in no acute distress. HEENT: Normocephalic and atraumatic. Neck: No JVD.   Lungs: Normal respiratory effort on room air. Clear bilaterally  to auscultation. No wheezes, crackles, rhonchi.  Heart: HRRR. Normal S1 and S2 without gallops or murmurs.  Abdomen: Non-distended appearing.  Msk: Normal strength and tone for age. Extremities: Warm and well perfused. No clubbing, cyanosis, edema.  Neuro: Alert and oriented X 3. Psych: Answers questions appropriately.   Labs: Basic Metabolic Panel: Recent Labs    10/16/23 0512 10/17/23 0545  NA 136  --   K 4.1  --   CL 104  --   CO2 20*  --   GLUCOSE 75  --   BUN 9  --   CREATININE 0.57*  --   CALCIUM  8.4*  --   MG 2.1 1.8  PHOS 2.9 3.1   Liver Function Tests: No results for input(s): AST, ALT, ALKPHOS, BILITOT, PROT, ALBUMIN in the last 72 hours.  No results for input(s): LIPASE, AMYLASE in the last 72 hours. CBC: Recent Labs    10/15/23 0309 10/16/23 0512  WBC 6.8 6.0  HGB 8.7* 8.3*  HCT 25.3* 25.5*  MCV 89.4 92.1  PLT 148* 170   Cardiac Enzymes: No results for input(s): CKTOTAL, CKMB, CKMBINDEX, TROPONINIHS in the last 72 hours.  BNP: No results for input(s): BNP in the last 72 hours. D-Dimer: No results for input(s): DDIMER in the last 72 hours. Hemoglobin A1C: No results for input(s): HGBA1C in the last 72 hours. Fasting Lipid Panel: No results  for input(s): CHOL, HDL, LDLCALC, TRIG, CHOLHDL, LDLDIRECT in the last 72 hours. Thyroid Function Tests: No results for input(s): TSH, T4TOTAL, T3FREE, THYROIDAB in the last 72 hours.  Invalid input(s): FREET3 Anemia Panel: No results for input(s): VITAMINB12, FOLATE, FERRITIN, TIBC, IRON, RETICCTPCT in the last 72 hours.    Radiology: CT HEAD WO CONTRAST ( ) Result Date: 10/14/2023 CLINICAL DATA:  Mental status change, persistent or worsening intermittent altered mentation, worse with hypotension. EXAM: CT HEAD WITHOUT CONTRAST TECHNIQUE: Contiguous axial images were obtained from the base of the skull through the vertex without intravenous contrast. RADIATION DOSE REDUCTION: This exam was performed according to the departmental dose-optimization program which includes automated exposure control, adjustment of the mA and/or kV according to patient size and/or use of iterative reconstruction technique. COMPARISON:  CT head 10/15/2009. FINDINGS: Brain: No evidence of acute infarction, hemorrhage, hydrocephalus, extra-axial collection or mass lesion/mass effect. Moderate to severe patchy and confluent white matter hypodensities, nonspecific but compatible with chronic microvascular ischemic change. Left anterior frontal encephalomalacia. Vascular: No hyperdense vessel.  Calcific atherosclerosis. Skull: No acute fracture. Sinuses/Orbits: Left maxillary sinus air-fluid level and partial opacification. No acute orbital findings. Other: No mastoid effusions. IMPRESSION: 1. No evidence of acute intracranial abnormality. 2. Left anterior frontal encephalomalacia. 3. Moderate to severe chronic microvascular ischemic change. 4. Left maxillary sinus air-fluid level and partial opacification. Electronically Signed   By: Gilmore GORMAN Molt M.D.   On: 10/14/2023 20:32   ECHOCARDIOGRAM COMPLETE Result Date: 10/13/2023    ECHOCARDIOGRAM REPORT   Patient Name:   Henry Mcmahon Date of  Exam: 10/12/2023 Medical Rec #:  [id number]      Height:       69.0 in Accession #:    [id number]     Weight:       138.0 lb Date of Birth:  May 05, 1940       BSA:          1.765 m Patient Age:    83 years       BP:           101/62 mmHg  Patient Gender: M              HR:           81 bpm. Exam Location:  [facility] Procedure: 2D Echo, Cardiac Doppler and Color Doppler (Both Spectral and Color            Flow Doppler were utilized during procedure). Indications:     Syncope R55                  Cardiomyopathy-unspecified I42.9  History:         Patient has no prior history of Echocardiogram examinations.                  CHF, Previous Myocardial Infarction, COPD; Risk                  Factors:Hypertension.  Sonographer:     Christopher Furnace Referring Phys:  [id number] AIDA CHO Diagnosing Phys: Marsa Dooms MD  Sonographer Comments: Technically difficult study due to poor echo windows, Technically challenging study due to limited acoustic windows, no apical window and no subcostal window. IMPRESSIONS  1. Left ventricular ejection fraction, by estimation, is 60 to 65%. The left ventricle has normal function. The left ventricle has no regional wall motion abnormalities. Left ventricular diastolic parameters were normal.  2. Right ventricular systolic function is normal. The right ventricular size is normal.  3. The mitral valve is normal in structure. Mild mitral valve regurgitation. No evidence of mitral stenosis.  4. The aortic valve is normal in structure. Aortic valve regurgitation is not visualized. No aortic stenosis is present.  5. There is mild dilatation of the aortic root, measuring 43 mm.  6. The inferior vena cava is normal in size with greater than 50% respiratory variability, suggesting right atrial pressure of 3 mmHg. FINDINGS  Left Ventricle: Left ventricular ejection fraction, by estimation, is 60 to 65%. The left ventricle has normal function. The left ventricle has no regional wall motion abnormalities.  Strain was performed and the global longitudinal strain is indeterminate. The left ventricular internal cavity size was normal in size. There is no left ventricular hypertrophy. Left ventricular diastolic parameters were normal. Right Ventricle: The right ventricular size is normal. No increase in right ventricular wall thickness. Right ventricular systolic function is normal. Left Atrium: Left atrial size was normal in size. Right Atrium: Right atrial size was normal in size. Pericardium: There is no evidence of pericardial effusion. Mitral Valve: The mitral valve is normal in structure. Mild mitral valve regurgitation. No evidence of mitral valve stenosis. Tricuspid Valve: The tricuspid valve is normal in structure. Tricuspid valve regurgitation is mild . No evidence of tricuspid stenosis. Aortic Valve: The aortic valve is normal in structure. Aortic valve regurgitation is not visualized. No aortic stenosis is present. Pulmonic Valve: The pulmonic valve was normal in structure. Pulmonic valve regurgitation is not visualized. No evidence of pulmonic stenosis. Aorta: The aortic root is normal in size and structure. There is mild dilatation of the aortic root, measuring 43 mm. Venous: The inferior vena cava is normal in size with greater than 50% respiratory variability, suggesting right atrial pressure of 3 mmHg. IAS/Shunts: No atrial level shunt detected by color flow Doppler. Additional Comments: 3D was performed not requiring image post processing on an independent workstation and was indeterminate.  LEFT VENTRICLE PLAX 2D LVIDd:         3.30 cm LVIDs:         2.20 cm LV  PW:         1.50 cm LV IVS:        1.60 cm LVOT diam:     2.30 cm LVOT Area:     4.15 cm  LEFT ATRIUM         Index LA diam:    3.80 cm 2.15 cm/m   AORTA Ao Root diam: 4.10 cm  SHUNTS Systemic Diam: 2.30 cm Marsa Dooms MD Electronically signed by Marsa Dooms MD Signature Date/Time: 10/13/2023/8:09:42 AM    Final    CT CHEST WO  CONTRAST Result Date: 10/12/2023 CLINICAL DATA:  Provided history: Chest wall pain, nontraumatic, no prior imaging Hematoma post pacemaker generator change out, rule out hemothorax. EXAM: CT CHEST WITHOUT CONTRAST TECHNIQUE: Multidetector CT imaging of the chest was performed following the standard protocol without IV contrast. RADIATION DOSE REDUCTION: This exam was performed according to the departmental dose-optimization program which includes automated exposure control, adjustment of the mA and/or kV according to patient size and/or use of iterative reconstruction technique. COMPARISON:  Chest radiograph 04/26/2012.  Chest CT 07/25/2009 FINDINGS: Cardiovascular: Left subclavian pacemaker with lead tips in the right atrium, right ventricle and coronary sinus, moderate associated streak artifact. No pericardial effusion. The heart is normal in size. Native coronary artery calcifications, post CABG. Aortic atherosclerosis. 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 Descending aorta is tortuous. No periaortic stranding. Mediastinum/Nodes: Small mediastinal lymph nodes are not enlarged by size criteria. Limited hilar assessment on this unenhanced exam. Patulous esophagus contains small amount of intraluminal fluid. Small hiatal hernia Lungs/Pleura: Moderate emphysema. No pneumothorax. No pleural fluid. Scattered areas of subsegmental atelectasis or scarring. Subpleural reticulation within the anterior lungs, chronic and likely related to scarring. No features of pulmonary edema. No suspicious pulmonary nodule. Upper Abdomen: Symmetric perinephric edema is partially included. Gallstone. Musculoskeletal: Left-sided pacemaker battery pack with surrounding regional streak artifact. High-density fluid surrounding the battery pack is best evaluated inferiorly spanning 8.4 cm transverse, series 2, image 79. Subcutaneous gas within the collection is most prominent medially. Surrounding soft tissue edema extends inferiorly in the  anterior chest wall, as well as superiorly into the supraclavicular soft tissues. Prior median sternotomy. Remote bilateral rib fractures. No acute osseous findings. IMPRESSION: 1. Left-sided pacemaker battery pack with surrounding high-density fluid, likely hematoma. Subcutaneous gas within the collection is most prominent medially. Surrounding soft tissue edema extends inferiorly in the anterior chest wall, as well as superiorly into the supraclavicular soft tissues. Sterility is technically indeterminate by imaging. 2. No pleural fluid, hemothorax or pneumothorax. 3. Emphysema. 4. Dilated ascending aorta at 4.1 cm. Recommend annual imaging followup by CTA or MRA. This recommendation follows 2010 ACCF/AHA/AATS/ACR/ASA/SCA/SCAI/SIR/STS/SVM Guidelines for the Diagnosis and Management of Patients with Thoracic Aortic Disease. Circulation. 2010; 121: Z733-z630. Aortic aneurysm NOS (ICD10-I71.9) Aortic Atherosclerosis (ICD10-I70.0) and Emphysema (ICD10-J43.9). Electronically Signed   By: Andrea Gasman M.D.   On: 10/12/2023 19:33   EP PPM/ICD IMPLANT Result Date: 10/12/2023 Successful BiV ICD generator change out (Medtronic Cobalt XT HF Quad)    ECHO as above.  TELEMETRY reviewed by me 10/17/2023: ventricular paced, rate 70s  EKG reviewed by me: ordered  Data reviewed by me 10/17/2023: last 24h vitals tele labs imaging I/O hospitalist note, critical care notes.  Principal Problem:   ICD (implantable cardioverter-defibrillator) pocket hematoma Active Problems:   Hypotension   Syncope   Shock circulatory (HCC)   Protein-calorie malnutrition, severe    ASSESSMENT AND PLAN:  Henry Mcmahon is a 83 y.o. male  with a past medical history of CAD s/p 3v CABG in 2002 (LIMA to LAD, SVG to ramus, SVG to D1) and multiple stents, chronic HFmrEF s/p Biventricular ICD, hypertension, hyperlipidemia who presented for outpatient ICD generator change out on 10/12/2023 due to device at [facility] at pacer clinic.  Patient  underwent ICD generator change out with Dr. Nolen s/p revealed significant hematoma, active bleeding and swelling. Of note patient was taking Plavix at home. In the specials recovery event, while holding pressure on incision site for about 10 minutes and patient suddenly became hypotensive, not responding to questions appropriately, not making eye contact.  Code stroke was called and patient started on IV fluids, BP improved and patient became more responsive and started to answer questions appropriately, code stroke canceled at this point.  Patient then transferred to stepdown.  # Shock, resolved # CAD s/p CABG x 3 and multiple stents (last [facility] 2016) # s/p Biventricular ICD  # AMS s/p generator changeout (07/22) # Chronic HFmrEF Patients presents for outpatient ICD generator changeout, at incision site had complications with hematoma and swelling. Trops negative x2.  Lactic elevated at 2.3. Patient appears euvolemic. BP borderline without pressors, improved s/p IVF. Patient required pressors from 07/22 to 07/23. Off pressors since 07/23 and BP much improved.  Hemoglobin stable.  Echo this admission with preserved EF of 60-65%, no RWMA.  Per telemetry adequately ventricular paced. Hgb up to 9.8 this morning.  -Will continue to monitor left chest hematoma. Much improved, remains stable. -Hold Plavix. Do not resume at discharge, will be discussed at outpatient cardiology follow-up. Likely would only recommend ASA 81 mg daily. -Continue atorvastatin  40 mg daily. -BP stable off midodrine . Resume home lisinopril  10 mg daily.  -Resume home lasix  20 mg daily.   Ok for discharge today from a cardiac perspective. Will arrange for follow up in clinic with Dr. Florencio in 1-2 weeks.    This patient's plan of care was discussed and created with Dr. Florencio and he is in agreement.  Signed: Danita Bloch, PA-C  10/17/2023, 9:16 AM [facility]

## 2023-10-17 NOTE — Discharge Summary (Signed)
 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  Patient underwent ICD generator change but was found to be bleeding around the ICD.  Bleeding was thought to be excessive and thus patient was admitted to [facility] to monitor overnight.  Soon after admission bleeding worsened and patient became hypotensive, unresponsive.  Reportedly SBP during this admit was 72.  Patient received IV fluids. Code stroke was called but later canceled as it felt the patient's altered mentation was secondary to hypotension.  His mentation gradually improved.  He was admitted to the ICU. He was requiring Levophed  to maintain a MAP of 65 or higher.  On 7/24 [facility] assumed care of the patient from ICU.  He was gradually tapered off of midodrine .  Stay was prolonged due to downtrending hemoglobin, worsening bruising to the left arm and hematoma surrounding pacemaker.  Eventually hemoglobin stabilized, and endorsed feeling ready to go home.  Home health was ordered prior to discharge  Hemorrhagic shock - Secondary to left chest pocket hematoma following ICD generator change out.  Patient was on Plavix at time of generator change - This was likely also complicated by hypovolemia given patient's poor p.o. intake. - Initially required Levophed , then midodrine .  Discontinued now.  Blood pressure stable  Acute blood loss  anemia -Hemoglobin down trended but stable now.  Need to follow-up with PCP in 1 week to ensure stability. - ER return precautions given   CAD status post CABG Biventricular ICD with generator change 10/12/2023 Chronic heart failure preserved EF Echocardiogram: EF 60 to 65%, mild mitral valve regurg. - CT chest negative for hemothorax, left-sided pacemaker battery pack with surrounding high density fluid, likely hematoma - Outpatient follow-up with cardiology established  Leukocytosis, resolved - No obvious signs or symptoms of infection at this time. - Antibiotics discontinued - Blood cultures negative at 4 days   Episodic altered mentation - No witnessed events personally.  Per report episodes appear to resolve quickly and spontaneously without evidence of a postictal period.  Appear to be related to hypotension.  Could be revealing areas of prior damage with hypoperfusion though the patient has no notable history of CVA. - 7/25 head CT without acute intracranial abnormality, does demonstrate left anterior frontal encephalomalacia and moderate to severe chronic microvascular ischemic changes. - Code stroke earlier this admission was canceled as it was thought symptoms were secondary to hypotension. - Patient remained stable with no further episodes during my care.  Hypomagnesemia Hypophosphatemia - Replace as needed   COPD - Currently stable  - Resume home meds   Alcohol abuse - 3-4 beers daily, has been counseled on cessation -No evidence of withdrawal during this admission.   Tobacco abuse -1/4 pack a day, has been counseled on cessation   Generalized weakness - Complicated by comorbidities, and advanced age.  Patient resides with 2 elderly sisters - Home health has been ordered at discharge.  Have counseled the patient on safety at home.  He is not interested in  rehab/SNF  Discharge Instructions  Discharge Instructions     (HEART FAILURE PATIENTS) Call MD:  Anytime  you have any of the following symptoms: 1) 3 pound weight gain in 24 hours or 5 pounds in 1 week 2) shortness of breath, with or without a dry hacking cough 3) swelling in the hands, feet or stomach 4) if you have to sleep on extra pillows at night in order to breathe.   Complete by: As directed    Call MD for:  difficulty breathing, headache or visual disturbances   Complete by: As directed    Call MD for:  persistant dizziness or light-headedness   Complete by: As directed    Call MD for:  persistant nausea and vomiting   Complete by: As directed    Call MD for:  severe uncontrolled pain   Complete by: As directed    Call MD for:  temperature >100.4   Complete by: As directed    Diet - low sodium heart healthy   Complete by: As directed    Discharge instructions   Complete by: As directed    Please follow-up with your primary care physician in 1 week to recheck your hemoglobin.  Follow-up with cardiology as scheduled in the clinic   Increase activity slowly   Complete by: As directed       Allergies as of 10/17/2023   No Known Allergies      Medication List     TAKE these medications    atorvastatin  40 MG tablet Commonly known as: LIPITOR Take 40 mg by mouth at bedtime.   cephALEXin  500 MG capsule Commonly known as: KEFLEX  Take 1 capsule (500 mg total) by mouth 2 (two) times daily.   cholecalciferol 25 MCG (1000 UNIT) tablet Commonly known as: VITAMIN D3 Take 1,000 Units by mouth daily.   furosemide  20 MG tablet Commonly known as: LASIX  Take 20 mg by mouth daily.   HYDROcodone -acetaminophen  7.5-325 MG tablet Commonly known as: NORCO Take 1 tablet by mouth every 6 (six) hours as needed for up to 3 days for severe pain (pain score 7-10) (take for pain not relieved by tylenol  alone.).   isosorbide mononitrate 30 MG 24 hr tablet Commonly known as: IMDUR 30 mg daily.   lisinopril  10 MG tablet Commonly known as: ZESTRIL  Take 10 mg by mouth daily.   nitroGLYCERIN  0.4 MG SL tablet Commonly known as: NITROSTAT Place 0.4 mg under the tongue every 5 (five) minutes as needed for chest pain.   pantoprazole  20 MG tablet Commonly known as: PROTONIX  Take 20 mg by mouth daily.   tamsulosin 0.4 MG Caps capsule Commonly known as: FLOMAX Take 0.4 mg by mouth daily.   vitamin B-12 500 MCG tablet Commonly known as: CYANOCOBALAMIN Take 500 mcg by mouth daily.        Follow-up Information     Callwood, Dwayne D, MD Follow up in 10 day(s).   Specialties: Cardiology, Internal Medicine Why: Thursday October 28, 2023 at 2:15pm. Arrive 15 minutes early. Contact information: [address] [place] [address] [phone]                No Known Allergies  Consultations:    Procedures/Studies: CT HEAD WO CONTRAST ( ) Result Date: 10/14/2023 CLINICAL DATA:  Mental status change, persistent or worsening intermittent altered mentation, worse with hypotension. EXAM: CT HEAD WITHOUT CONTRAST TECHNIQUE: Contiguous axial images were obtained from the base of the skull through the vertex without intravenous contrast. RADIATION  DOSE REDUCTION: This exam was performed according to the departmental dose-optimization program which includes automated exposure control, adjustment of the mA and/or kV according to patient size and/or use of iterative reconstruction technique. COMPARISON:  CT head 10/15/2009. FINDINGS: Brain: No evidence of acute infarction, hemorrhage, hydrocephalus, extra-axial collection or mass lesion/mass effect. Moderate to severe patchy and confluent white matter hypodensities, nonspecific but compatible with chronic microvascular ischemic change. Left anterior frontal encephalomalacia. Vascular: No hyperdense vessel.  Calcific atherosclerosis. Skull: No acute fracture. Sinuses/Orbits: Left maxillary sinus air-fluid level and partial opacification. No acute orbital findings. Other: No mastoid effusions. IMPRESSION: 1. No evidence of  acute intracranial abnormality. 2. Left anterior frontal encephalomalacia. 3. Moderate to severe chronic microvascular ischemic change. 4. Left maxillary sinus air-fluid level and partial opacification. Electronically Signed   By: Gilmore GORMAN Molt M.D.   On: 10/14/2023 20:32   ECHOCARDIOGRAM COMPLETE Result Date: 10/13/2023    ECHOCARDIOGRAM REPORT   Patient Name:   Henry Mcmahon Date of Exam: 10/12/2023 Medical Rec #:  [id number]      Height:       69.0 in Accession #:    [id number]     Weight:       138.0 lb Date of Birth:  May 15, 1940       BSA:          1.765 m Patient Age:    83 years       BP:           101/62 mmHg Patient Gender: M              HR:           81 bpm. Exam Location:  [facility] Procedure: 2D Echo, Cardiac Doppler and Color Doppler (Both Spectral and Color            Flow Doppler were utilized during procedure). Indications:     Syncope R55                  Cardiomyopathy-unspecified I42.9  History:         Patient has no prior history of Echocardiogram examinations.                  CHF, Previous Myocardial Infarction, COPD; Risk                  Factors:Hypertension.  Sonographer:     Christopher Furnace Referring Phys:  [id number] AIDA CHO Diagnosing Phys: Marsa Dooms MD  Sonographer Comments: Technically difficult study due to poor echo windows, Technically challenging study due to limited acoustic windows, no apical window and no subcostal window. IMPRESSIONS  1. Left ventricular ejection fraction, by estimation, is 60 to 65%. The left ventricle has normal function. The left ventricle has no regional wall motion abnormalities. Left ventricular diastolic parameters were normal.  2. Right ventricular systolic function is normal. The right ventricular size is normal.  3. The mitral valve is normal in structure. Mild mitral valve regurgitation. No evidence of mitral stenosis.  4. The aortic valve is normal in structure. Aortic valve regurgitation is not visualized. No aortic stenosis is present.  5.  There is mild dilatation of the aortic root, measuring 43 mm.  6. The inferior vena cava is normal in size with greater than 50% respiratory variability, suggesting right atrial pressure of 3 mmHg. FINDINGS  Left Ventricle: Left ventricular ejection fraction, by estimation, is 60 to 65%. The left ventricle has normal function. The left ventricle has no regional  wall motion abnormalities. Strain was performed and the global longitudinal strain is indeterminate. The left ventricular internal cavity size was normal in size. There is no left ventricular hypertrophy. Left ventricular diastolic parameters were normal. Right Ventricle: The right ventricular size is normal. No increase in right ventricular wall thickness. Right ventricular systolic function is normal. Left Atrium: Left atrial size was normal in size. Right Atrium: Right atrial size was normal in size. Pericardium: There is no evidence of pericardial effusion. Mitral Valve: The mitral valve is normal in structure. Mild mitral valve regurgitation. No evidence of mitral valve stenosis. Tricuspid Valve: The tricuspid valve is normal in structure. Tricuspid valve regurgitation is mild . No evidence of tricuspid stenosis. Aortic Valve: The aortic valve is normal in structure. Aortic valve regurgitation is not visualized. No aortic stenosis is present. Pulmonic Valve: The pulmonic valve was normal in structure. Pulmonic valve regurgitation is not visualized. No evidence of pulmonic stenosis. Aorta: The aortic root is normal in size and structure. There is mild dilatation of the aortic root, measuring 43 mm. Venous: The inferior vena cava is normal in size with greater than 50% respiratory variability, suggesting right atrial pressure of 3 mmHg. IAS/Shunts: No atrial level shunt detected by color flow Doppler. Additional Comments: 3D was performed not requiring image post processing on an independent workstation and was indeterminate.  LEFT VENTRICLE PLAX 2D LVIDd:          3.30 cm LVIDs:         2.20 cm LV PW:         1.50 cm LV IVS:        1.60 cm LVOT diam:     2.30 cm LVOT Area:     4.15 cm  LEFT ATRIUM         Index LA diam:    3.80 cm 2.15 cm/m   AORTA Ao Root diam: 4.10 cm  SHUNTS Systemic Diam: 2.30 cm Marsa Dooms MD Electronically signed by Marsa Dooms MD Signature Date/Time: 10/13/2023/8:09:42 AM    Final    CT CHEST WO CONTRAST Result Date: 10/12/2023 CLINICAL DATA:  Provided history: Chest wall pain, nontraumatic, no prior imaging Hematoma post pacemaker generator change out, rule out hemothorax. EXAM: CT CHEST WITHOUT CONTRAST TECHNIQUE: Multidetector CT imaging of the chest was performed following the standard protocol without IV contrast. RADIATION DOSE REDUCTION: This exam was performed according to the departmental dose-optimization program which includes automated exposure control, adjustment of the mA and/or kV according to patient size and/or use of iterative reconstruction technique. COMPARISON:  Chest radiograph 04/26/2012.  Chest CT 07/25/2009 FINDINGS: Cardiovascular: Left subclavian pacemaker with lead tips in the right atrium, right ventricle and coronary sinus, moderate associated streak artifact. No pericardial effusion. The heart is normal in size. Native coronary artery calcifications, post CABG. Aortic atherosclerosis. 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 Descending aorta is tortuous. No periaortic stranding. Mediastinum/Nodes: Small mediastinal lymph nodes are not enlarged by size criteria. Limited hilar assessment on this unenhanced exam. Patulous esophagus contains small amount of intraluminal fluid. Small hiatal hernia Lungs/Pleura: Moderate emphysema. No pneumothorax. No pleural fluid. Scattered areas of subsegmental atelectasis or scarring. Subpleural reticulation within the anterior lungs, chronic and likely related to scarring. No features of pulmonary edema. No suspicious pulmonary nodule. Upper Abdomen: Symmetric  perinephric edema is partially included. Gallstone. Musculoskeletal: Left-sided pacemaker battery pack with surrounding regional streak artifact. High-density fluid surrounding the battery pack is best evaluated inferiorly spanning 8.4 cm transverse, series 2, image 79.  Subcutaneous gas within the collection is most prominent medially. Surrounding soft tissue edema extends inferiorly in the anterior chest wall, as well as superiorly into the supraclavicular soft tissues. Prior median sternotomy. Remote bilateral rib fractures. No acute osseous findings. IMPRESSION: 1. Left-sided pacemaker battery pack with surrounding high-density fluid, likely hematoma. Subcutaneous gas within the collection is most prominent medially. Surrounding soft tissue edema extends inferiorly in the anterior chest wall, as well as superiorly into the supraclavicular soft tissues. Sterility is technically indeterminate by imaging. 2. No pleural fluid, hemothorax or pneumothorax. 3. Emphysema. 4. Dilated ascending aorta at 4.1 cm. Recommend annual imaging followup by CTA or MRA. This recommendation follows 2010 ACCF/AHA/AATS/ACR/ASA/SCA/SCAI/SIR/STS/SVM Guidelines for the Diagnosis and Management of Patients with Thoracic Aortic Disease. Circulation. 2010; 121: Z733-z630. Aortic aneurysm NOS (ICD10-I71.9) Aortic Atherosclerosis (ICD10-I70.0) and Emphysema (ICD10-J43.9). Electronically Signed   By: Andrea Gasman M.D.   On: 10/12/2023 19:33   EP PPM/ICD IMPLANT Result Date: 10/12/2023 Successful BiV ICD generator change out (Medtronic Cobalt XT HF Quad)      Discharge Exam: Vitals:   10/17/23 0258 10/17/23 0849  BP: 127/65 (!) 139/54  Pulse: 61 80  Resp: 16 17  Temp: 97.6 F (36.4 C) 97.6 F (36.4 C)  SpO2: 98% 98%   Vitals:   10/16/23 2327 10/17/23 0258 10/17/23 0500 10/17/23 0849  BP: (!) 123/51 127/65  (!) 139/54  Pulse: 69 61  80  Resp: 20 16  17   Temp: 97.7 F (36.5 C) 97.6 F (36.4 C)  97.6 F (36.4 C)   TempSrc:    Oral  SpO2: 100% 98%  98%  Weight:   61.7 kg   Height:        Constitutional:  Normal appearance.  Chronically ill-appearing` HENT: Head Normocephalic and atraumatic.  Mucous membranes are moist.  Cardiovascular: Rate and Rhythm: Normal rate and regular rhythm.  Pulmonary: Non labored, symmetric rise of chest wall.  Skin: Bruising on left arm and chest wall as in photos below.  Beginning to improve.  Ecchymosis in various stages of healing Neurological: Alert, oriented.  Does require some prompting to answer questions. Psychiatric: Mood and Affect congruent.     The results of significant diagnostics from this hospitalization (including imaging, microbiology, ancillary and laboratory) are listed below for reference.     Microbiology: Recent Results (from the past 240 hours)  MRSA Next Gen by PCR, Nasal     Status: None   Collection Time: 10/12/23  1:33 PM   Specimen: Nasal Mucosa; Nasal Swab  Result Value Ref Range Status   MRSA by PCR Next Gen NOT DETECTED NOT DETECTED Final    Comment: (NOTE) The GeneXpert MRSA Assay (FDA approved for NASAL specimens only), is one component of a comprehensive MRSA colonization surveillance program. It is not intended to diagnose MRSA infection nor to guide or monitor treatment for MRSA infections. Test performance is not FDA approved in patients less than 44 years old. Performed at [facility], [address] [place], [place] [address]   Culture, blood (Routine X 2) w Reflex to ID Panel     Status: None   Collection Time: 10/12/23  7:38 PM   Specimen: BLOOD  Result Value Ref Range Status   Specimen Description BLOOD BLOOD RIGHT HAND  Final   Special Requests   Final    BOTTLES DRAWN AEROBIC AND ANAEROBIC Blood Culture adequate volume   Culture   Final    NO GROWTH 5 DAYS Performed at [facility],  [address] [place], [place] [address]    Report Status 10/17/2023 FINAL  Final  Culture, blood  (Routine X 2) w Reflex to ID Panel     Status: None   Collection Time: 10/12/23  9:24 PM   Specimen: BLOOD  Result Value Ref Range Status   Specimen Description BLOOD BLOOD RIGHT HAND  Final   Special Requests Blood Culture adequate volume  Final   Culture   Final    NO GROWTH 5 DAYS Performed at [facility], [address] [place], [place] [address]    Report Status 10/17/2023 FINAL  Final     Labs: BNP (last 3 results) No results for input(s): BNP in the last 8760 hours. Basic Metabolic Panel: Recent Labs  Lab 10/12/23 1221 10/13/23 0351 10/14/23 0335 10/15/23 0309 10/16/23 0512 10/17/23 0545  NA 131* 133* 134*  --  136  --   K 3.7 4.3 4.1  --  4.1  --   CL 100 102 103  --  104  --   CO2 20* 22 24  --  20*  --   GLUCOSE 116* 128* 79  --  75  --   BUN 12 15 11   --  9  --   CREATININE 0.49* 0.71 0.58*  --  0.57*  --   CALCIUM  8.7* 8.8* 8.5*  --  8.4*  --   MG  --   --  1.5* 2.3 2.1 1.8  PHOS  --   --  2.3* 3.5 2.9 3.1   Liver Function Tests: Recent Labs  Lab 10/12/23 1221  AST 23  ALT 16  ALKPHOS 44  BILITOT 1.3*  PROT 6.1*  ALBUMIN 3.6   No results for input(s): LIPASE, AMYLASE in the last 168 hours. No results for input(s): AMMONIA in the last 168 hours. CBC: Recent Labs  Lab 10/12/23 1221 10/12/23 1410 10/13/23 0351 10/14/23 0335 10/15/23 0309 10/16/23 0512 10/17/23 0832  WBC 8.8   < > 8.1 8.2 6.8 6.0 7.7  NEUTROABS 6.0  --   --  5.9  --   --   --   HGB 11.8*   < > 10.6* 9.4* 8.7* 8.3* 9.8*  HCT 34.5*   < > 31.2* 27.6* 25.3* 25.5* 29.4*  MCV 90.1   < > 88.1 89.6 89.4 92.1 92.5  PLT 168   < > 163 141* 148* 170 228   < > = values in this interval not displayed.   Cardiac Enzymes: No results for input(s): CKTOTAL, CKMB, CKMBINDEX, TROPONINI in the last 168 hours. BNP: Invalid input(s): POCBNP CBG: Recent Labs  Lab 10/12/23 1325 10/14/23 0439  GLUCAP 120* 79   D-Dimer No results for input(s): DDIMER in the  last 72 hours. Hgb A1c No results for input(s): HGBA1C in the last 72 hours. Lipid Profile No results for input(s): CHOL, HDL, LDLCALC, TRIG, CHOLHDL, LDLDIRECT in the last 72 hours. Thyroid function studies No results for input(s): TSH, T4TOTAL, T3FREE, THYROIDAB in the last 72 hours.  Invalid input(s): FREET3 Anemia work up No results for input(s): VITAMINB12, FOLATE, FERRITIN, TIBC, IRON, RETICCTPCT in the last 72 hours. Urinalysis No results found for: COLORURINE, APPEARANCEUR, LABSPEC, PHURINE, GLUCOSEU, HGBUR, BILIRUBINUR, KETONESUR, PROTEINUR, UROBILINOGEN, NITRITE, LEUKOCYTESUR Sepsis Labs Recent Labs  Lab 10/14/23 0335 10/15/23 0309 10/16/23 0512 10/17/23 0832  WBC 8.2 6.8 6.0 7.7   Microbiology Recent Results (from the past 240 hours)  MRSA Next Gen by PCR, Nasal     Status: None  Collection Time: 10/12/23  1:33 PM   Specimen: Nasal Mucosa; Nasal Swab  Result Value Ref Range Status   MRSA by PCR Next Gen NOT DETECTED NOT DETECTED Final    Comment: (NOTE) The GeneXpert MRSA Assay (FDA approved for NASAL specimens only), is one component of a comprehensive MRSA colonization surveillance program. It is not intended to diagnose MRSA infection nor to guide or monitor treatment for MRSA infections. Test performance is not FDA approved in patients less than 28 years old. Performed at [facility], [address] [place], [place] [address]   Culture, blood (Routine X 2) w Reflex to ID Panel     Status: None   Collection Time: 10/12/23  7:38 PM   Specimen: BLOOD  Result Value Ref Range Status   Specimen Description BLOOD BLOOD RIGHT HAND  Final   Special Requests   Final    BOTTLES DRAWN AEROBIC AND ANAEROBIC Blood Culture adequate volume   Culture   Final    NO GROWTH 5 DAYS Performed at [facility], [address], [place], [place] [address]    Report Status 10/17/2023 FINAL  Final   Culture, blood (Routine X 2) w Reflex to ID Panel     Status: None   Collection Time: 10/12/23  9:24 PM   Specimen: BLOOD  Result Value Ref Range Status   Specimen Description BLOOD BLOOD RIGHT HAND  Final   Special Requests Blood Culture adequate volume  Final   Culture   Final    NO GROWTH 5 DAYS Performed at [facility], [address] [place] [address], [place], [place] [address]    Report Status 10/17/2023 FINAL  Final     Time coordinating discharge: 32 min   SIGNED: Lorane Poland, DO [facility] 10/17/2023, 10:52 AM Pager   If 7PM-7AM, please contact night-coverage

## 2023-10-18 ENCOUNTER — Other Ambulatory Visit: Payer: Self-pay

## 2023-10-18 ENCOUNTER — Encounter: Payer: Self-pay | Admitting: Emergency Medicine

## 2023-10-18 ENCOUNTER — Emergency Department
Admission: EM | Admit: 2023-10-18 | Discharge: 2023-10-18 | Disposition: A | Discharge status: Home / Self Care | Attending: Emergency Medicine | Admitting: Emergency Medicine

## 2023-10-18 DIAGNOSIS — Y829 Unspecified medical devices associated with adverse incidents: Secondary | ICD-10-CM | POA: Diagnosis not present

## 2023-10-18 DIAGNOSIS — T82838A Hemorrhage of vascular prosthetic devices, implants and grafts, initial encounter: Secondary | ICD-10-CM | POA: Insufficient documentation

## 2023-10-18 DIAGNOSIS — L7682 Other postprocedural complications of skin and subcutaneous tissue: Secondary | ICD-10-CM

## 2023-10-18 NOTE — Discharge Instructions (Signed)
 If bleeding start again hold pressure for 10 minutes and return to the emergency department as needed

## 2023-10-18 NOTE — ED Provider Triage Note (Signed)
 Emergency Medicine Provider Triage Evaluation Note  Henry Mcmahon , a 83 y.o. male  was evaluated in triage.  Pt complains of left hand swelling.  Patient was seen in July 20; they ordered CBC CMP lactic acid all labs came back negative.  Today patient has hematoma in the left hand after removing a Band-Aid on his wrist and started bleeding.  Patient is vomiting during triage, family states patient is alcoholic he has not eaten anything today but drinking alcohol.  Review of Systems  Positive: Alcohol Negative:   Physical Exam  Pulse 65   Temp 97.7 F (36.5 C) (Oral)   Resp 18   Ht 5' 10 (1.778 m)   Wt 59 kg   SpO2 100%   BMI 18.65 kg/m  Gen:   Awake, no distress during triage vital signs are normal Resp:  Normal effort MSK:   Moves extremities without difficulty  Other:  Left hand: Evidence of hematoma in the dorsal area of the left hand.  Medical Decision Making  Medically screening exam initiated at 4:57 PM.  Appropriate orders placed.  Henry Mcmahon was informed that the remainder of the evaluation will be completed by another provider, this initial triage assessment does not replace that evaluation, and the importance of remaining in the ED until their evaluation is complete. Patient with chief complaint of hematoma in the left hand after removing a Band-Aid patient is vomiting during triage, family states patient has not eaten today but he is drinking alcohol.  There is CBC CMP to rule out dehydration.    Janit Kast, PA-C 10/18/23 1700

## 2023-10-18 NOTE — ED Triage Notes (Signed)
 Patient to ED via POV for laceration/skin tear to left arm. Family reports that he had bandage on area and when he took it off it started bleeding. Arm black and blue from procedure yesterday. Swelling noted to arm.    Family reports he has not ate today but has only drank beer. PT now dry heaving in triage.

## 2023-10-18 NOTE — ED Provider Notes (Signed)
   [facility] Provider Note    Event Date/Time   First MD Initiated Contact with Patient 10/18/23 1701     (approximate)   History   Bleeding   HPI  KYLE LUPPINO is a 83 y.o. male who presents with bleeding from left wrist which was the insertion site for catheterization performed very recently.  He is apparently on Plavix.  EMS was able to stop the bleeding with gauze.  Otherwise he feels well and has no complaints at this time     Physical Exam   Triage Vital Signs: ED Triage Vitals  Encounter Vitals Group     BP 10/18/23 1655 (!) 167/147     Girls Systolic BP Percentile --      Girls Diastolic BP Percentile --      Boys Systolic BP Percentile --      Boys Diastolic BP Percentile --      Pulse Rate 10/18/23 1654 65     Resp 10/18/23 1654 18     Temp 10/18/23 1654 97.7 F (36.5 C)     Temp Source 10/18/23 1654 Oral     SpO2 10/18/23 1654 100 %     Weight 10/18/23 1652 59 kg (130 lb)     Height 10/18/23 1652 1.778 m (5' 10)     Head Circumference --      Peak Flow --      Pain Score 10/18/23 1652 0     Pain Loc --      Pain Education --      Exclude from Growth Chart --     Most recent vital signs: Vitals:   10/18/23 1655 10/18/23 1842  BP: (!) 167/147 (!) 159/102  Pulse:  68  Resp:  20  Temp:    SpO2:  100%     General: Awake, no distress.  CV:  Good peripheral perfusion.  Resp:  Normal effort.  Abd:  No distention.  Other:  Left wrist: Gauze carefully removed, no active bleeding.  Redressed   ED Results / Procedures / Treatments   Labs (all labs ordered are listed, but only abnormal results are displayed) Labs Reviewed  CBC WITH DIFFERENTIAL/PLATELET  COMPREHENSIVE METABOLIC PANEL WITH GFR     EKG     RADIOLOGY     PROCEDURES:  Critical Care performed:   Procedures   MEDICATIONS ORDERED IN ED: Medications - No data to display   IMPRESSION / MDM / ASSESSMENT AND PLAN / ED COURSE  I reviewed the  triage vital signs and the nursing notes. Patient's presentation is most consistent with acute, uncomplicated illness.  Patient presents with bleeding from wrist as described above, symptoms had resolved with gentle pressure from gauze.  Only a small amount of bleeding reported.  Observed in the emergency department, no further bleeding after redress  No indication for admission at this time, appropriate for discharge with close outpatient follow-up        FINAL CLINICAL IMPRESSION(S) / ED DIAGNOSES   Final diagnoses:  Bleeding at insertion site     Rx / DC Orders   ED Discharge Orders     None        Note:  This document was prepared using Dragon voice recognition software and may include unintentional dictation errors.   Arlander Charleston, MD 10/18/23 2105

## 2023-11-11 ENCOUNTER — Other Ambulatory Visit: Payer: Self-pay | Admitting: Nurse Practitioner

## 2023-11-11 ENCOUNTER — Ambulatory Visit
Admission: RE | Admit: 2023-11-11 | Discharge: 2023-11-11 | Disposition: A | Discharge status: Home / Self Care | Source: Ambulatory Visit | Attending: Nurse Practitioner | Admitting: Nurse Practitioner

## 2023-11-11 DIAGNOSIS — Z950 Presence of cardiac pacemaker: Secondary | ICD-10-CM | POA: Diagnosis present

## 2023-11-11 DIAGNOSIS — R600 Localized edema: Secondary | ICD-10-CM

## 2023-12-21 ENCOUNTER — Other Ambulatory Visit (INDEPENDENT_AMBULATORY_CARE_PROVIDER_SITE_OTHER): Payer: Self-pay | Admitting: Nurse Practitioner

## 2023-12-21 DIAGNOSIS — I808 Phlebitis and thrombophlebitis of other sites: Secondary | ICD-10-CM

## 2023-12-22 ENCOUNTER — Ambulatory Visit (INDEPENDENT_AMBULATORY_CARE_PROVIDER_SITE_OTHER): Payer: PRIVATE HEALTH INSURANCE | Admitting: Nurse Practitioner

## 2023-12-22 ENCOUNTER — Ambulatory Visit (INDEPENDENT_AMBULATORY_CARE_PROVIDER_SITE_OTHER)

## 2023-12-22 ENCOUNTER — Encounter (INDEPENDENT_AMBULATORY_CARE_PROVIDER_SITE_OTHER): Payer: Self-pay | Admitting: Nurse Practitioner

## 2023-12-22 VITALS — BP 109/68 | HR 80 | Ht 70.0 in | Wt 128.4 lb

## 2023-12-22 DIAGNOSIS — E782 Mixed hyperlipidemia: Secondary | ICD-10-CM | POA: Diagnosis not present

## 2023-12-22 DIAGNOSIS — I809 Phlebitis and thrombophlebitis of unspecified site: Secondary | ICD-10-CM | POA: Diagnosis not present

## 2023-12-22 DIAGNOSIS — I808 Phlebitis and thrombophlebitis of other sites: Secondary | ICD-10-CM

## 2023-12-22 DIAGNOSIS — I10 Essential (primary) hypertension: Secondary | ICD-10-CM | POA: Diagnosis not present

## 2023-12-22 NOTE — Progress Notes (Signed)
 Subjective:    Patient ID: Henry Mcmahon, male    DOB: 1940-04-04, 83 y.o.   MRN: [id number] Chief Complaint  Patient presents with   Follow-up    LUE venous + consult (LS 2021 GS/FB). evaluate subclavian vein + LUE veins. US  in Epic. cheek parrish, mary.    The patient is an 83 year old male who presents today for evaluation of swelling in his left upper extremity.  This swelling occurred following pacemaker placement.  He had a an IV in the area of the arm that developed a superficial phlebitis in the area of the cephalic vein.  Initially had pain and swelling in the left upper extremity.  Since it was initially discovered the pain and swelling have all resolved.  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  The area is also showing improvement from initial studies.    Review of Systems  Neurological:  Positive for weakness.  All other systems reviewed and are negative.      Objective:   Physical Exam Vitals reviewed.  HENT:     Head: Normocephalic.  Cardiovascular:     Rate and Rhythm: Normal rate.     Pulses:          Radial pulses are 2+ on the left side.  Pulmonary:     Effort: Pulmonary effort is normal.  Skin:    General: Skin is warm and dry.  Neurological:     Mental Status: He is alert and oriented to person, place, and time.  Psychiatric:        Mood and Affect: Mood normal.        Behavior: Behavior normal.        Thought Content: Thought content normal.        Judgment: Judgment normal.     BP 109/68   Pulse 80   Ht 5' 10 (1.778 m)   Wt 128 lb 6.4 oz (58.2 kg)   BMI 18.42 kg/m   Past Medical History:  Diagnosis Date   Cancer (HCC)    prostate   CHF (congestive heart failure) (HCC)    Collagen vascular disease    COPD (chronic obstructive pulmonary disease) (HCC)    Coronary artery disease    ETOH abuse    Gastric ulcer    H/O: UGI bleed    History of blood transfusion    HLD (hyperlipidemia)     Hypertension    Myocardial infarction (HCC)    VHD (valvular heart disease)     Social History   Socioeconomic History   Marital status: Single    Spouse name: Not on file   Number of children: Not on file   Years of education: Not on file   Highest education level: Not on file  Occupational History   Not on file  Tobacco Use   Smoking status: Every Day    Current packs/day: 0.25    Types: Cigarettes   Smokeless tobacco: Never  Substance and Sexual Activity   Alcohol use: Yes    Alcohol/week: 36.0 standard drinks of alcohol    Types: 36 Cans of beer per week   Drug use: No   Sexual activity: Not on file  Other Topics Concern   Not on file  Social History Narrative   Not on file   Social Drivers of Health   Financial Resource Strain: Low Risk  (10/27/2023)   Received from [facility]   Overall Financial Resource Strain (  CARDIA)    Difficulty of Paying Living Expenses: Not hard at all  Food Insecurity: No Food Insecurity (10/27/2023)   Received from [facility]   Hunger Vital Sign    Within the past 12 months, you worried that your food would run out before you got the money to buy more.: Never true    Within the past 12 months, the food you bought just didn't last and you didn't have money to get more.: Never true  Transportation Needs: No Transportation Needs (10/27/2023)   Received from [facility] - [facility]    In the past 12 months, has lack of transportation kept you from medical appointments or from getting medications?: No    Lack of Transportation (Non-Medical): No  Physical Activity: Not on file  Stress: Not on file  Social Connections: Patient Declined (10/12/2023)   Social Connection and Isolation Panel    Frequency of Communication with Friends and Family: Patient declined    Frequency of Social Gatherings with Friends and Family: Patient declined    Attends Religious Services: Patient  declined    Active Member of Clubs or Organizations: Patient declined    Attends Banker Meetings: Patient declined    Marital Status: Patient declined  Intimate Partner Violence: Patient Declined (10/12/2023)   Humiliation, Afraid, Rape, and Kick questionnaire    Fear of Current or Ex-Partner: Patient declined    Emotionally Abused: Patient declined    Physically Abused: Patient declined    Sexually Abused: Patient declined    Past Surgical History:  Procedure Laterality Date   COLON SURGERY     COLONOSCOPY     CORONARY ARTERY BYPASS GRAFT     HEMORRHOID SURGERY     ICD GENERATOR CHANGEOUT N/A 10/12/2023   Procedure: ICD GENERATOR CHANGEOUT;  Surgeon: Ammon Blunt, MD;  Location: [facility] INVASIVE CV LAB;  Service: Cardiovascular;  Laterality: N/A;   INSERT / REPLACE / REMOVE PACEMAKER     LUMBAR DISC SURGERY     MICROLARYNGOSCOPY N/A 02/19/2016   Procedure: MICROLARYNGOSCOPY;  Surgeon: Deward Dolly, MD;  Location: [facility] ORS;  Service: ENT;  Laterality: N/A;   UPPER GASTROINTESTINAL ENDOSCOPY      History reviewed. No pertinent family history.  No Known Allergies     Latest Ref Rng & Units 10/17/2023    8:32 AM 10/16/2023    5:12 AM 10/15/2023    3:09 AM  CBC  WBC 4.0 - 10.5 K/uL 7.7  6.0  6.8   Hemoglobin 13.0 - 17.0 g/dL 9.8  8.3  8.7   Hematocrit 39.0 - 52.0 % 29.4  25.5  25.3   Platelets 150 - 400 K/uL 228  170  148       CMP     Component Value Date/Time   NA 136 10/16/2023 0512   NA 137 04/26/2012 2220   K 4.1 10/16/2023 0512   K 4.6 04/26/2012 2220   CL 104 10/16/2023 0512   CL 107 04/26/2012 2220   CO2 20 (L) 10/16/2023 0512   CO2 22 04/26/2012 2220   GLUCOSE 75 10/16/2023 0512   GLUCOSE 99 04/26/2012 2220   BUN 9 10/16/2023 0512   BUN 6 (L) 04/26/2012 2220   CREATININE 0.57 (L) 10/16/2023 0512   CREATININE 0.86 04/26/2012 2220   CALCIUM  8.4 (L) 10/16/2023 0512   CALCIUM  8.7 04/26/2012 2220   PROT 6.1 (L) 10/12/2023 1221   PROT 7.1  04/26/2012 2220   ALBUMIN 3.6  10/12/2023 1221   ALBUMIN 3.6 04/26/2012 2220   AST 23 10/12/2023 1221   AST 27 04/26/2012 2220   ALT 16 10/12/2023 1221   ALT 20 04/26/2012 2220   ALKPHOS 44 10/12/2023 1221   ALKPHOS 63 04/26/2012 2220   BILITOT 1.3 (H) 10/12/2023 1221   BILITOT 0.6 04/26/2012 2220   GFRNONAA >60 10/16/2023 0512   GFRNONAA >60 04/26/2012 2220     No results found.     Assessment & Plan:   1. Thrombophlebitis (Primary) Today the patient does still have evidence of a phlebitis in the cephalic vein but it shows improvement and is now a chronic state.  His swelling has also resolved and is much better than it was previously.  There is no evidence of any subclavian obstruction.  Based on this intervention is recommended at this time.  No medication changes recommended.  2. Benign essential hypertension Continue antihypertensive medications as already ordered, these medications have been reviewed and there are no changes at this time.  3. Hyperlipidemia, mixed Continue statin as ordered and reviewed, no changes at this time   Current Outpatient Medications on File Prior to Visit  Medication Sig Dispense Refill   aspirin EC 81 MG tablet Take 81 mg by mouth.     atorvastatin  (LIPITOR) 40 MG tablet Take 40 mg by mouth at bedtime.     cephALEXin  (KEFLEX ) 500 MG capsule Take 1 capsule (500 mg total) by mouth 2 (two) times daily. 20 capsule 0   cholecalciferol (VITAMIN D3) 25 MCG (1000 UNIT) tablet Take 1,000 Units by mouth daily.     furosemide  (LASIX ) 20 MG tablet Take 20 mg by mouth daily.     isosorbide mononitrate (IMDUR) 30 MG 24 hr tablet 30 mg daily.     lisinopril  (ZESTRIL ) 10 MG tablet Take 10 mg by mouth daily.     nitroGLYCERIN (NITROSTAT) 0.4 MG SL tablet Place 0.4 mg under the tongue every 5 (five) minutes as needed for chest pain.     pantoprazole  (PROTONIX ) 20 MG tablet Take 20 mg by mouth daily.     tamsulosin (FLOMAX) 0.4 MG CAPS capsule Take 0.4 mg by  mouth daily.     vitamin B-12 (CYANOCOBALAMIN) 500 MCG tablet Take 500 mcg by mouth daily.     No current facility-administered medications on file prior to visit.    There are no Patient Instructions on file for this visit. No follow-ups on file.   Everline Mahaffy E Willa Brocks, NP
